# Patient Record
Sex: Male | Born: 1995 | Race: Black or African American | Hispanic: No | Marital: Single | Smoking: Never smoker
Health system: Southern US, Community
[De-identification: ages and names within clinical notes are randomized; demographics above are authoritative.]

---
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
## 2017-04-25 ENCOUNTER — Telehealth: Payer: Self-pay

## 2017-04-25 ENCOUNTER — Telehealth: Payer: Self-pay | Admitting: *Deleted

## 2017-04-25 NOTE — Telephone Encounter (Signed)
I already answered this-- ok to reestablish but probably can not see him 12/24 for cpe --- already have several and that is supposed to be saved for acutes

## 2017-04-25 NOTE — Telephone Encounter (Signed)
Copied from CRM [phone]. Topic: Appointment Scheduling - Prior Auth Required for Appointment >> Apr 25, 2017  8:44 AM Jolayne Hainesaylor, Brittany L wrote: Patient is requesting 12/24 for new pt to re est care, last seen in 2014. appointment. Per scheduling protocol, this appointment requires a prior authorization prior to scheduling.  I called the Office & Clydie BraunKaren advised me to send a message to see if Dr. Laury AxonLowne will re consider him for RE EST CARE OF HIM. He has not been seen since 2014 due to being away in college, he is coming in for christmas and wants to see her for a physical but I advised his grandmother he was no longer a patient & had to re est care. I sch the appt per one note that states she sees 1 new pt per session not paying attention that it has to be approved by the Dr. Laury AxonLowne, please call patient if this needs to be canceled if she will not approve it. I do apologize.

## 2017-04-25 NOTE — Telephone Encounter (Signed)
Pt requesting to re-establish care. Please adivse.

## 2017-04-25 NOTE — Telephone Encounter (Signed)
Copied from CRM #[id number]. Topic: Appointment Scheduling - Prior Auth Required for Appointment >> Apr 25, 2017  8:44 AM Taylor, Brittany L wrote: Patient is requesting 12/24 for new pt to re est care, last seen in 2014. appointment. Per scheduling protocol, this appointment requires a prior authorization prior to scheduling.  I called the Office & Karen advised me to send a message to see if Dr. Lowne will re consider him for RE EST CARE OF HIM. He has not been seen since 2014 due to being away in college, he is coming in for christmas and wants to see her for a physical but I advised his grandmother he was no longer a patient & had to re est care. I sch the appt per one note that states she sees 1 new pt per session not paying attention that it has to be approved by the Dr. Lowne, please call patient if this needs to be canceled if she will not approve it. I do apologize.   

## 2017-04-25 NOTE — Telephone Encounter (Signed)
Pt can have appointment but 12/24 is supposed to be for acutes only

## 2017-05-02 ENCOUNTER — Encounter: Payer: Self-pay | Admitting: Family Medicine

## 2017-05-02 ENCOUNTER — Other Ambulatory Visit (HOSPITAL_COMMUNITY)
Admission: RE | Admit: 2017-05-02 | Discharge: 2017-05-02 | Disposition: A | Discharge status: Home / Self Care | Payer: BLUE CROSS/BLUE SHIELD | Source: Ambulatory Visit | Attending: Family Medicine | Admitting: Family Medicine

## 2017-05-02 ENCOUNTER — Ambulatory Visit: Payer: BLUE CROSS/BLUE SHIELD | Admitting: Family Medicine

## 2017-05-02 VITALS — BP 104/74 | HR 73 | Temp 98.4°F | Ht 75.0 in | Wt 135.0 lb

## 2017-05-02 DIAGNOSIS — Z0000 Encounter for general adult medical examination without abnormal findings: Secondary | ICD-10-CM | POA: Diagnosis not present

## 2017-05-02 DIAGNOSIS — Z7252 High risk homosexual behavior: Secondary | ICD-10-CM | POA: Insufficient documentation

## 2017-05-02 DIAGNOSIS — Z23 Encounter for immunization: Secondary | ICD-10-CM | POA: Diagnosis not present

## 2017-05-02 LAB — COMPREHENSIVE METABOLIC PANEL
ALK PHOS: 44 U/L (ref 39–117)
ALT: 10 U/L (ref 0–53)
AST: 14 U/L (ref 0–37)
Albumin: 4.6 g/dL (ref 3.5–5.2)
BILIRUBIN TOTAL: 0.5 mg/dL (ref 0.2–1.2)
BUN: 12 mg/dL (ref 6–23)
CO2: 30 meq/L (ref 19–32)
Calcium: 9.6 mg/dL (ref 8.4–10.5)
Chloride: 104 mEq/L (ref 96–112)
Creatinine, Ser: 0.9 mg/dL (ref 0.40–1.50)
GFR: 135.53 mL/min (ref >60.00)
GLUCOSE: 86 mg/dL (ref 70–99)
Potassium: 4.6 mEq/L (ref 3.5–5.1)
SODIUM: 141 meq/L (ref 135–145)
TOTAL PROTEIN: 7.1 g/dL (ref 6.0–8.3)

## 2017-05-02 LAB — CBC WITH DIFFERENTIAL/PLATELET
Basophils Absolute: 0 10*3/uL (ref 0.0–0.1)
Basophils Relative: 0.9 % (ref 0.0–3.0)
EOS PCT: 1.8 % (ref 0.0–5.0)
Eosinophils Absolute: 0.1 10*3/uL (ref 0.0–0.7)
HEMATOCRIT: 44.1 % (ref 39.0–52.0)
HEMOGLOBIN: 14.2 g/dL (ref 13.0–17.0)
LYMPHS ABS: 2.5 10*3/uL (ref 0.7–4.0)
LYMPHS PCT: 53.9 % — AB (ref 12.0–46.0)
MCHC: 32.2 g/dL (ref 30.0–36.0)
MCV: 91.2 fl (ref 78.0–100.0)
MONOS PCT: 8.3 % (ref 3.0–12.0)
Monocytes Absolute: 0.4 10*3/uL (ref 0.1–1.0)
Neutro Abs: 1.6 10*3/uL (ref 1.4–7.7)
Neutrophils Relative %: 35.1 % — ABNORMAL LOW (ref 43.0–77.0)
Platelets: 301 10*3/uL (ref 150.0–400.0)
RBC: 4.83 Mil/uL (ref 4.22–5.81)
RDW: 13.5 % (ref 11.5–15.5)
WBC: 4.6 10*3/uL (ref 4.0–10.5)

## 2017-05-02 LAB — LIPID PANEL
Cholesterol: 137 mg/dL (ref 0–200)
HDL: 73.4 mg/dL (ref >39.00)
LDL Cholesterol: 56 mg/dL (ref 0–99)
NONHDL: 63.76
Total CHOL/HDL Ratio: 2
Triglycerides: 40 mg/dL (ref 0.0–149.0)
VLDL: 8 mg/dL (ref 0.0–40.0)

## 2017-05-02 LAB — TSH: TSH: 1.28 u[IU]/mL (ref 0.35–4.50)

## 2017-05-02 MED ORDER — EMTRICITABINE-TENOFOVIR DF 200-300 MG PO TABS: 1.0000 | ORAL_TABLET | Freq: Every day | ORAL | 2 refills | Status: DC

## 2017-05-02 NOTE — Assessment & Plan Note (Signed)
See avs ghm utd rto 1 month for men b #2

## 2017-05-02 NOTE — Patient Instructions (Signed)
Preventive Care 18-39 Years, Male Preventive care refers to lifestyle choices and visits with your health care provider that can promote health and wellness. What does preventive care include?  A yearly physical exam. This is also called an annual well check.  Dental exams once or twice a year.  Routine eye exams. Ask your health care provider how often you should have your eyes checked.  Personal lifestyle choices, including: ? Daily care of your teeth and gums. ? Regular physical activity. ? Eating a healthy diet. ? Avoiding tobacco and drug use. ? Limiting alcohol use. ? Practicing safe sex. What happens during an annual well check? The services and screenings done by your health care provider during your annual well check will depend on your age, overall health, lifestyle risk factors, and family history of disease. Counseling Your health care provider may ask you questions about your:  Alcohol use.  Tobacco use.  Drug use.  Emotional well-being.  Home and relationship well-being.  Sexual activity.  Eating habits.  Work and work Statistician.  Screening You may have the following tests or measurements:  Height, weight, and BMI.  Blood pressure.  Lipid and cholesterol levels. These may be checked every 5 years starting at age 34.  Diabetes screening. This is done by checking your blood sugar (glucose) after you have not eaten for a while (fasting).  Skin check.  Hepatitis C blood test.  Hepatitis B blood test.  Sexually transmitted disease (STD) testing.  Discuss your test results, treatment options, and if necessary, the need for more tests with your health care provider. Vaccines Your health care provider may recommend certain vaccines, such as:  Influenza vaccine. This is recommended every year.  Tetanus, diphtheria, and acellular pertussis (Tdap, Td) vaccine. You may need a Td booster every 10 years.  Varicella vaccine. You may need this if you  have not been vaccinated.  HPV vaccine. If you are 23 or younger, you may need three doses over 6 months.  Measles, mumps, and rubella (MMR) vaccine. You may need at least one dose of MMR.You may also need a second dose.  Pneumococcal 13-valent conjugate (PCV13) vaccine. You may need this if you have certain conditions and have not been vaccinated.  Pneumococcal polysaccharide (PPSV23) vaccine. You may need one or two doses if you smoke cigarettes or if you have certain conditions.  Meningococcal vaccine. One dose is recommended if you are age 65-21 years and a first-year college student living in a residence hall, or if you have one of several medical conditions. You may also need additional booster doses.  Hepatitis A vaccine. You may need this if you have certain conditions or if you travel or work in places where you may be exposed to hepatitis A.  Hepatitis B vaccine. You may need this if you have certain conditions or if you travel or work in places where you may be exposed to hepatitis B.  Haemophilus influenzae type b (Hib) vaccine. You may need this if you have certain risk factors.  Talk to your health care provider about which screenings and vaccines you need and how often you need them. This information is not intended to replace advice given to you by your health care provider. Make sure you discuss any questions you have with your health care provider. Document Released: 06/22/2001 Document Revised: 01/14/2016 Document Reviewed: 02/25/2015 Elsevier Interactive Patient Education  [employer].

## 2017-05-02 NOTE — Assessment & Plan Note (Signed)
rto repeat labs

## 2017-05-02 NOTE — Progress Notes (Signed)
Subjective:  I acted as a Education administrator for [employer], [place]   Patient ID: Johnny Nelson, male    DOB: January 28, 1996, 21 y.o.   MRN: [id number]  Chief Complaint  Patient presents with  . Establish Care    Pt has been out of care for about 4 years and is here today for routine check up and labs.   . Labs Only    Wants HIV testing added to labs today.     HPI  Patient is in today to establish care. No complaints Patient Care Team: Carollee Herter, Alferd Apa, DO as PCP - General (Family Medicine)   History reviewed. No pertinent past medical history.  History reviewed. No pertinent surgical history.  Family History  Problem Relation Age of Onset  . Multiple sclerosis Mother   . Diabetes Maternal Grandmother   . Hyperlipidemia Unknown     Social History   Socioeconomic History  . Marital status: Single    Spouse name: Not on file  . Number of children: Not on file  . Years of education: Not on file  . Highest education level: Not on file  Social Needs  . Financial resource strain: Not on file  . Food insecurity - worry: Never true  . Food insecurity - inability: Never true  . Transportation needs - medical: No  . Transportation needs - non-medical: No  Occupational History  . Not on file  Tobacco Use  . Smoking status: Never Smoker  . Smokeless tobacco: Never Used  Substance and Sexual Activity  . Alcohol use: No  . Drug use: No  . Sexual activity: Yes    Partners: Male    Birth control/protection: None  Other Topics Concern  . Not on file  Social History Narrative   Lives with mom and 2 younger siblings   Pt in college at [employer] Medications Prior to Visit  Medication Sig Dispense Refill  . cyclobenzaprine (FLEXERIL) 5 MG tablet 1-2 qhs prn 14 tablet 0  . traMADol (ULTRAM) 50 MG tablet 1 q 8 hrs prn 30 tablet 0   No facility-administered medications prior to visit.     No Known Allergies  Review of Systems  Constitutional:  Negative for chills, fever and malaise/fatigue.  HENT: Negative for congestion and hearing loss.   Eyes: Negative for blurred vision and discharge.  Respiratory: Negative for cough, sputum production and shortness of breath.   Cardiovascular: Negative for chest pain, palpitations and leg swelling.  Gastrointestinal: Negative for abdominal pain, blood in stool, constipation, diarrhea, heartburn, nausea and vomiting.  Genitourinary: Negative for dysuria, frequency, hematuria and urgency.  Musculoskeletal: Negative for back pain, falls and myalgias.  Skin: Negative for rash.  Neurological: Negative for dizziness, sensory change, loss of consciousness, weakness and headaches.  Endo/Heme/Allergies: Negative for environmental allergies. Does not bruise/bleed easily.  Psychiatric/Behavioral: Negative for depression and suicidal ideas. The patient is not nervous/anxious and does not have insomnia.        Objective:    Physical Exam  Constitutional: He is oriented to person, place, and time. He appears well-developed and well-nourished. No distress.  HENT:  Head: Normocephalic and atraumatic.  Right Ear: External ear normal.  Left Ear: External ear normal.  Nose: Nose normal.  Mouth/Throat: Oropharynx is clear and moist. No oropharyngeal exudate.  Eyes: Conjunctivae and EOM are normal. Pupils are equal, round, and reactive to light. Right eye exhibits no discharge. Left eye exhibits no discharge.  Neck: Normal  range of motion. Neck supple. No JVD present. No thyromegaly present.  Cardiovascular: Normal rate, regular rhythm and intact distal pulses. Exam reveals no gallop and no friction rub.  No murmur heard. Pulmonary/Chest: Effort normal and breath sounds normal. No respiratory distress. He has no wheezes. He has no rales. He exhibits no tenderness.  Abdominal: Soft. Bowel sounds are normal. He exhibits no distension and no mass. There is no tenderness. There is no rebound and no guarding.    Genitourinary: Penis normal.  Musculoskeletal: Normal range of motion. He exhibits no edema or tenderness.  Lymphadenopathy:    He has no cervical adenopathy.  Neurological: He is alert and oriented to person, place, and time. He displays normal reflexes. He exhibits normal muscle tone.  Skin: Skin is warm and dry. No rash noted. He is not diaphoretic. No erythema. No pallor.  Psychiatric: He has a normal mood and affect. His behavior is normal. Judgment and thought content normal.  Nursing note and vitals reviewed.   BP 104/74   Pulse 73   Temp 98.4 F (36.9 C) (Oral)   Ht _0  (1.905 m)   Wt 135 lb (61.2 kg)   SpO2 98%   BMI 16.87 kg/m  Wt Readings from Last 3 Encounters:  05/02/17 135 lb (61.2 kg)  06/07/13 146 lb 9.6 oz (66.5 kg) (46 %, Z= -0.11)*  01/30/13 141 lb 3.2 oz (64 kg) (39 %, Z= -0.28)*   * Growth percentiles are based on CDC (Boys, 2-20 Years) data.   BP Readings from Last 3 Encounters:  05/02/17 104/74  06/07/13 104/67  01/30/13 (!) 90/62 (<1 %, Z < -2.33 /  15 %, Z = -1.04)*   *BP percentiles are based on the August 2017 AAP Clinical Practice Guideline for boys     Immunization History  Administered Date(s) Administered  . DTP 05/20/1995, 07/29/1995, 10/05/1995, 09/14/1996, 04/01/1999  . Hepatitis A 09/07/2006, 11/11/2009  . Hepatitis B 1994/11/12, 04/19/1995, 10/05/1995  . HiB (PRP-OMP) 05/20/1995, 07/29/1995, 10/05/1995, 09/14/1996  . Hpv 11/11/2009, 01/20/2010, 05/28/2010  . Influenza,inj,Quad PF,6+ Mos 01/30/2013  . Influenza-Unspecified 02/06/2017  . MMR 03/27/1996, 04/01/1999  . Meningococcal B, OMV 05/02/2017  . Meningococcal Mcv4o 05/02/2017  . Meningococcal Polysaccharide 10/30/2007  . OPV 05/20/1995, 07/29/1995, 10/05/1995, 04/01/1999  . PPD Test 10/18/2011  . Td 09/07/2006  . Tdap 05/02/2017  . Varicella 10/30/2007, 11/11/2009    Health Maintenance  Topic Date Due  . Samul Dada  09/06/2016  . INFLUENZA VACCINE  12/08/2016  .  HIV Screening  Completed    Lab Results  Component Value Date   WBC 5.0 12/14/2011   HGB 13.9 12/14/2011   HCT 42.4 12/14/2011   PLT 290.0 12/14/2011   GLUCOSE 98 12/14/2011   CHOL 166 12/14/2011   TRIG 48.0 12/14/2011   HDL 83.30 12/14/2011   LDLCALC 73 12/14/2011   ALT 15 12/14/2011   AST 18 12/14/2011   NA 142 12/14/2011   K 4.5 12/14/2011   CL 103 12/14/2011   CREATININE 0.9 12/14/2011   BUN 15 12/14/2011   CO2 29 12/14/2011   TSH 0.87 12/14/2011    Lab Results  Component Value Date   TSH 0.87 12/14/2011   Lab Results  Component Value Date   WBC 5.0 12/14/2011   HGB 13.9 12/14/2011   HCT 42.4 12/14/2011   MCV 90.1 12/14/2011   PLT 290.0 12/14/2011   Lab Results  Component Value Date   NA 142 12/14/2011   K 4.5 12/14/2011  CO2 29 12/14/2011   GLUCOSE 98 12/14/2011   BUN 15 12/14/2011   CREATININE 0.9 12/14/2011   BILITOT 0.8 12/14/2011   ALKPHOS 87 12/14/2011   AST 18 12/14/2011   ALT 15 12/14/2011   PROT 7.2 12/14/2011   ALBUMIN 4.7 12/14/2011   CALCIUM 9.8 12/14/2011   GFR 149.14 12/14/2011   Lab Results  Component Value Date   CHOL 166 12/14/2011   Lab Results  Component Value Date   HDL 83.30 12/14/2011   Lab Results  Component Value Date   LDLCALC 73 12/14/2011   Lab Results  Component Value Date   TRIG 48.0 12/14/2011   Lab Results  Component Value Date   CHOLHDL 2 12/14/2011   No results found for: HGBA1C       Assessment & Plan:   Problem List Items Addressed This Visit      Unprioritized   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    rto repeat labs      Relevant Medications   emtricitabine-tenofovir (TRUVADA) 200-300 MG tablet   Other Relevant Orders   HIV antibody   RPR   Urine cytology ancillary only   Hepatitis, Acute   Preventative health care    See avs ghm utd rto 1 month for men b #2         Relevant Orders   HIV antibody   TSH   Lipid panel   CBC with Differential/Platelet   RPR   Urine  cytology ancillary only   Comprehensive metabolic panel   Hepatitis, Acute    Other Visit Diagnoses    Need for meningitis vaccination       Relevant Orders   MENINGOCOCCAL MCV4O(MENVEO) (Completed)   Meningococcal B, OMV (Completed)   Need for Tdap vaccination       Relevant Orders   Tdap vaccine greater than or equal to 7yo IM (Completed)      I have discontinued Shaiden Andreas's traMADol and cyclobenzaprine. I am also having him start on emtricitabine-tenofovir.  Meds ordered this encounter  Medications  . emtricitabine-tenofovir (TRUVADA) 200-300 MG tablet    Sig: Take 1 tablet by mouth daily.    Dispense:  30 tablet    Refill:  2    CMA served as scribe during this visit. History, Physical and Plan performed by medical provider. Documentation and orders reviewed and attested to.  Ann Held, DO

## 2017-05-03 LAB — RPR: RPR Ser Ql: NONREACTIVE

## 2017-05-03 LAB — HEPATITIS PANEL, ACUTE
Hep A IgM: NONREACTIVE
Hep B C IgM: NONREACTIVE
Hepatitis B Surface Ag: NONREACTIVE
Hepatitis C Ab: NONREACTIVE
SIGNAL TO CUT-OFF: 0.04 (ref <1.00)

## 2017-05-03 LAB — HIV ANTIBODY (ROUTINE TESTING W REFLEX): HIV: NONREACTIVE

## 2017-05-04 LAB — URINE CYTOLOGY ANCILLARY ONLY
Chlamydia: NEGATIVE
Neisseria Gonorrhea: NEGATIVE
TRICH (WINDOWPATH): NEGATIVE

## 2017-05-05 LAB — URINE CYTOLOGY ANCILLARY ONLY
BACTERIAL VAGINITIS: NEGATIVE
CANDIDA VAGINITIS: NEGATIVE

## 2019-02-03 DIAGNOSIS — Z20828 Contact with and (suspected) exposure to other viral communicable diseases: Secondary | ICD-10-CM | POA: Diagnosis not present

## 2019-05-11 DIAGNOSIS — Z8711 Personal history of peptic ulcer disease: Secondary | ICD-10-CM

## 2019-05-11 HISTORY — DX: Personal history of peptic ulcer disease: Z87.11

## 2019-08-06 DIAGNOSIS — Z20828 Contact with and (suspected) exposure to other viral communicable diseases: Secondary | ICD-10-CM | POA: Diagnosis not present

## 2019-12-04 DIAGNOSIS — K219 Gastro-esophageal reflux disease without esophagitis: Secondary | ICD-10-CM | POA: Diagnosis not present

## 2020-05-10 DIAGNOSIS — K219 Gastro-esophageal reflux disease without esophagitis: Secondary | ICD-10-CM

## 2020-05-10 HISTORY — DX: Gastro-esophageal reflux disease without esophagitis: K21.9

## 2020-12-05 DIAGNOSIS — R059 Cough, unspecified: Secondary | ICD-10-CM | POA: Diagnosis not present

## 2020-12-05 DIAGNOSIS — U071 COVID-19: Secondary | ICD-10-CM | POA: Diagnosis not present

## 2021-06-17 ENCOUNTER — Ambulatory Visit: Payer: BLUE CROSS/BLUE SHIELD | Admitting: Family Medicine

## 2021-06-30 ENCOUNTER — Ambulatory Visit (INDEPENDENT_AMBULATORY_CARE_PROVIDER_SITE_OTHER): Payer: 59 | Admitting: Family Medicine

## 2021-06-30 ENCOUNTER — Encounter: Payer: Self-pay | Admitting: Family Medicine

## 2021-06-30 ENCOUNTER — Other Ambulatory Visit (HOSPITAL_COMMUNITY): Payer: Self-pay

## 2021-06-30 ENCOUNTER — Telehealth: Payer: Self-pay

## 2021-06-30 VITALS — BP 125/71 | HR 95 | Ht 75.0 in | Wt 141.8 lb

## 2021-06-30 DIAGNOSIS — J392 Other diseases of pharynx: Secondary | ICD-10-CM

## 2021-06-30 DIAGNOSIS — Z7252 High risk homosexual behavior: Secondary | ICD-10-CM | POA: Diagnosis not present

## 2021-06-30 DIAGNOSIS — Z23 Encounter for immunization: Secondary | ICD-10-CM | POA: Diagnosis not present

## 2021-06-30 NOTE — Telephone Encounter (Signed)
[facility] Patient Product/process development scientist completed.    The patient is insured through [employer].  Truvada $0.00 [place] $211.11 will need a copay card. Apretude is covered under medical benefits but will need a PA.  We will continue to follow to see if copay assistance is needed.  Clearance Coots, CPhT [facility] Phone: [phone] Fax:  [phone]

## 2021-06-30 NOTE — Patient Instructions (Signed)
Thank you for choosing [facility] at [facility] for your Primary Care needs. I am excited for the opportunity to partner with you to meet your health care goals. It was a pleasure meeting you today!  Recommendations from today's visit: Let me know if your throat symptoms do not clear up in the next few weeks and we can get you to ENT  Information on diet, exercise, and health maintenance recommendations are listed below. This is information to help you be sure you are on track for optimal health and monitoring.   Please look over this and let us know if you have any questions or if you have completed any of the health maintenance outside of [place] so that we can be sure your records are up to date.  ___________________________________________________________  MyChart:  For all urgent or time sensitive needs we ask that you please call the office to avoid delays. Our number is [phone]. MyChart is not constantly monitored and due to the large volume of messages a day, replies may take up to 72 business hours.  MyChart Policy: MyChart allows for you to see your visit notes, after visit summary, provider recommendations, lab and tests results, make an appointment, request refills, and contact your provider or the office for non-urgent questions or concerns. Providers are seeing patients during normal business hours and do not have built in time to review MyChart messages.  We ask that you allow a minimum of 3 business days for responses to [employer]. For this reason, please do not send urgent requests through [place]. Please call the office at [phone]. New and ongoing conditions may require a visit. We have virtual and in-person visits available for your convenience.  Complex MyChart concerns may require a visit. Your provider may request you schedule a virtual or in-person visit to ensure we are providing the best care possible. MyChart messages sent after  11:00 AM on Friday will not be received by the provider until Monday morning.    Lab and Test Results: You will receive your lab and test results on MyChart as soon as they are completed and results have been sent by the lab or testing facility. Due to this service, you will receive your results BEFORE your provider.  I review lab and test results each morning prior to seeing patients. Some results require collaboration with other providers to ensure you are receiving the most appropriate care. For this reason, we ask that you please allow a minimum of 3-5 business days from the time that ALL results have been received for your provider to receive and review lab and test results and contact you about these.  Most lab and test result comments from the provider will be sent through [place]. Your provider may recommend changes to the plan of care, follow-up visits, repeat testing, ask questions, or request an office visit to discuss these results. You may reply directly to this message or call the office to provide information for the provider or set up an appointment. In some instances, you will be called with test results and recommendations. Please let us know if this is preferred and we will make note of this in your chart to provide this for you.    If you have not heard a response to your lab or test results in 5 business days from all results returning to [place], please call the office to let us know. We ask that you please avoid calling prior to this  time unless there is an emergent concern. Due to high call volumes, this can delay the resulting process.  After Hours: For all non-emergency after hours needs, please call the office at [phone] and select the option to reach the on-call  service. On-call services are shared between multiple [facility] offices and therefore it will not be possible to speak directly with your provider. On-call providers may provide medical advice and  recommendations, but are unable to provide refills for maintenance medications.  For all emergency or urgent medical needs after normal business hours, we recommend that you seek care at the closest Urgent Care or Emergency Department to ensure appropriate treatment in a timely manner.  [facility] has a 24 hour emergency room located on the ground floor for your convenience.   Urgent Concerns During the Business Day Providers are seeing patients from 8AM to [place] with a busy schedule and are most often not able to respond to non-urgent calls until the end of the day or the next business day. If you should have URGENT concerns during the day, please call and speak to the nurse or schedule a same day appointment so that we can address your concern without delay.   Thank you, again, for choosing me as your health care partner. I appreciate your trust and look forward to learning more about you.   [employer] [employer] Olevia Bowens, DNP, FNP-C  ___________________________________________________________  Health Maintenance Recommendations Screening Testing Mammogram Every 1-2 years based on history and risk factors Starting at age 27 Pap Smear Ages 21-39 every 3 years Ages 50-65 every 5 years with HPV testing More frequent testing may be required based on results and history Colon Cancer Screening Every 1-10 years based on test performed, risk factors, and history Starting at age 27 Bone Density Screening Every 2-10 years based on history Starting at age 27 for women Recommendations for men differ based on medication usage, history, and risk factors AAA Screening One time ultrasound Men 27-27 years old who have ever smoked Lung Cancer Screening Low Dose Lung CT every 12 months Age 27-27 years with a 20 pack-year smoking history who still smoke or who have quit within the last 15 years  Screening Labs Routine  Labs: Complete Blood Count (CBC), Complete Metabolic Panel (CMP),  Cholesterol (Lipid Panel) Every 6-12 months based on history and medications May be recommended more frequently based on current conditions or previous results Hemoglobin A1c Lab Every 3-12 months based on history and previous results Starting at age 17 or earlier with diagnosis of diabetes, high cholesterol, BMI >26, and/or risk factors Frequent monitoring for patients with diabetes to ensure blood sugar control Thyroid Panel (TSH w/ T3 & T4) Every 6 months based on history, symptoms, and risk factors May be repeated more often if on medication HIV One time testing for all patients 52 and older May be repeated more frequently for patients with increased risk factors or exposure Hepatitis C One time testing for all patients 1 and older May be repeated more frequently for patients with increased risk factors or exposure Gonorrhea, Chlamydia Every 12 months for all sexually active persons 13-24 years Additional monitoring may be recommended for those who are considered high risk or who have symptoms PSA Men 19-53 years old with risk factors Additional screening may be recommended from age 9-69 based on risk factors, symptoms, and history  Vaccine Recommendations Tetanus Booster All adults every 10 years Flu Vaccine All patients 6 months and older every year COVID  Vaccine All patients 12 years and older Initial dosing with booster May recommend additional booster based on age and health history HPV Vaccine 2 doses all patients age 41-26 Dosing may be considered for patients over 26 Shingles Vaccine (Shingrix) 2 doses all adults 21 years and older Pneumonia (Pneumovax 23) All adults 69 years and older May recommend earlier dosing based on health history Pneumonia (Prevnar 51) All adults 63 years and older Dosed 1 year after Pneumovax 23 Pneumonia (Prevnar 53) All adults 80 years and older (adults [id number] with certain conditions or risk factors) 1 dose  For those who have no  received Prevnar 13 vaccine previously   Additional Screening, Testing, and Vaccinations may be recommended on an individualized basis based on family history, health history, risk factors, and/or exposure.  __________________________________________________________  Diet Recommendations for All Patients  I recommend that all patients maintain a diet low in saturated fats, carbohydrates, and cholesterol. While this can be challenging at first, it is not impossible and small changes can make big differences.  Things to try: Decreasing the amount of soda, sweet tea, and/or juice to one or less per day and replace with water While water is always the first choice, if you do not like water you may consider adding a water additive without sugar to improve the taste other sugar free drinks Replace potatoes with a brightly colored vegetable at dinner Use healthy oils, such as canola oil or olive oil, instead of butter or hard margarine Limit your bread intake to two pieces or less a day Replace regular pasta with low carb pasta options Bake, broil, or grill foods instead of frying Monitor portion sizes  Eat smaller, more frequent meals throughout the day instead of large meals  An important thing to remember is, if you love foods that are not great for your health, you don't have to give them up completely. Instead, allow these foods to be a reward when you have done well. Allowing yourself to still have special treats every once in a while is a nice way to tell yourself thank you for working hard to keep yourself healthy.   Also remember that every day is a new day. If you have a bad day and "fall off the wagon", you can still climb right back up and keep moving along on your journey!  We have resources available to help you!  Some websites that may be helpful include: www.http://carter.biz/  Www.VeryWellFit.com _____________________________________________________________  Activity  Recommendations for All Patients  I recommend that all adults get at least 20 minutes of moderate physical activity that elevates your heart rate at least 5 days out of the week.  Some examples include: Walking or jogging at a pace that allows you to carry on a conversation Cycling (stationary bike or outdoors) Water aerobics Yoga Weight lifting Dancing If physical limitations prevent you from putting stress on your joints, exercise in a pool or seated in a chair are excellent options.  Do determine your MAXIMUM heart rate for activity: YOUR AGE - 220 = MAX HeartRate   Remember! Do not push yourself too hard.  Start slowly and build up your pace, speed, weight, time in exercise, etc.  Allow your body to rest between exercise and get good sleep. You will need more water than normal when you are exerting yourself. Do not wait until you are thirsty to drink. Drink with a purpose of getting in at least 8, 8 ounce glasses of water a day plus more depending  on how much you exercise and sweat.    If you begin to develop dizziness, chest pain, abdominal pain, jaw pain, shortness of breath, headache, vision changes, lightheadedness, or other concerning symptoms, stop the activity and allow your body to rest. If your symptoms are severe, seek emergency evaluation immediately. If your symptoms are concerning, but not severe, please let us know so that we can recommend further evaluation.

## 2021-06-30 NOTE — Progress Notes (Signed)
______________________________________________________________________  HPI Johnny Nelson is a 26 y.o. male presenting to [facility] at [facility] today to establish care.    Patient Care Team: Terrilyn Saver, NP as PCP - General (Family Medicine)   Health Maintenance  Topic Date Due   COVID-19 Vaccine (1) Never done   Tetanus Vaccine  05/03/2027   Flu Shot  Completed   HPV Vaccine  Completed   Hepatitis C Screening: USPSTF Recommendation to screen - Ages 18-79 yo.  Completed   HIV Screening  Completed     Concerns today: - feels like he has something stuck in his throat for the past 3-4 days maybe like a popcorn kernel (has tried drinking hot tea); no pain, fevers, sore throat, rhinorrhea, nasal congestion; not sure of anything he could have eaten that may have gotten stuck; he is a smoker - no voice changes, visible lesions or tongue/mouth, weight loss, cough - PrEP - previously on Descovy, but is interested in checking out other options, insurance will be switching next month due to new job; negative STD screening last month per patient at health department      Patient Active Problem List   Diagnosis Date Noted   Preventative health care 05/02/2017   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 ______________________________________________________________________ PMH No past medical history on file.  ROS All review of systems negative except what is listed in the HPI  PHYSICAL EXAM Physical Exam Vitals reviewed.  Constitutional:      Appearance: Normal appearance. He is normal weight.  HENT:     Head: Normocephalic and atraumatic.     Nose: Nose normal.     Mouth/Throat:     Mouth: Mucous membranes are moist.      Pharynx: Oropharynx is clear. No oropharyngeal exudate or posterior oropharyngeal erythema.  Cardiovascular:     Rate and Rhythm: Normal rate and regular rhythm.     Pulses: Normal pulses.     Heart sounds: Normal heart sounds.  Pulmonary:     Effort: Pulmonary effort is normal.     Breath sounds: Normal breath sounds.  Musculoskeletal:     Cervical back: Normal range of motion and neck supple. No tenderness.  Lymphadenopathy:     Cervical: No cervical adenopathy.  Skin:    General: Skin is warm and dry.  Neurological:     General: No focal deficit present.     Mental Status: He is alert and oriented to person, place, and time. Mental status is at baseline.  Psychiatric:        Mood and Affect: Mood normal.        Behavior: Behavior normal.        Thought Content: Thought content normal.        Judgment: Judgment normal.   ______________________________________________________________________ ASSESSMENT AND PLAN  1. Influenza vaccine needed - Flu Vaccine QUAD 6+ mos PF IM (Fluarix Quad PF)  2. High risk homosexual behavior Patient would like ID referral for PrEP options and future STD screening - declines needing any today.  - Ambulatory referral to Infectious Disease  3. Throat irritation Continue supportive measures for another week or two - warm liquids, soft foods, salt water gargles, etc. Patient aware of signs/symptoms  requiring further/urgent evaluation. If not improving, we can refer to ENT for further evaluation to be cautious since he is a smoker.    Establish care: Education provided today during visit and on AVS for patient to review at home.  Diet and Exercise recommendations provided.  Current diagnoses and recommendations discussed. HM recommendations reviewed with recommendations.    Outpatient Encounter Medications as of 06/30/2021  Medication Sig   [DISCONTINUED] emtricitabine-tenofovir (TRUVADA) 200-300 MG tablet Take 1 tablet by mouth daily.    No facility-administered encounter medications on file as of 06/30/2021.    Return in about 4 months (around 10/28/2021) for physical .    Lovena Le B. Olevia Bowens, DNP, FNP-C

## 2021-07-01 ENCOUNTER — Ambulatory Visit: Payer: 59 | Admitting: Pharmacist

## 2021-07-06 ENCOUNTER — Other Ambulatory Visit: Payer: Self-pay

## 2021-07-06 ENCOUNTER — Encounter (INDEPENDENT_AMBULATORY_CARE_PROVIDER_SITE_OTHER): Payer: Self-pay | Admitting: *Deleted

## 2021-07-06 VITALS — BP 118/75 | HR 66 | Temp 97.6°F | Ht 74.5 in | Wt 138.1 lb

## 2021-07-06 DIAGNOSIS — Z006 Encounter for examination for normal comparison and control in clinical research program: Secondary | ICD-10-CM

## 2021-07-06 NOTE — Research (Signed)
Johnny Nelson was here today to screen for the Purpose 2 prevention study.  He denies any current problems or medications. He had reviewed both the incidence and randomized screening consents at home. We went over the incidence screening consent and he verified that he had had condomless receptive anal sex with 2 partners in the past 12 weeks and had not been tested for HIV. He had been on oral prep over 1 year ago but recently got gonorrhea, so thought he probably needed to get back on Prep.  He was instructed on acute HIV symptoms and HIV prevention.  He tested negative with the rapid HIV test today so we proceeded to the randomized screening visit. Unfortunately we were unable to get his labs drawn today for that part of the study and will have him return in 2 days to complete those.

## 2021-07-07 LAB — RPR: RPR Ser Ql: NONREACTIVE

## 2021-07-08 ENCOUNTER — Other Ambulatory Visit: Payer: Self-pay

## 2021-07-08 ENCOUNTER — Encounter (INDEPENDENT_AMBULATORY_CARE_PROVIDER_SITE_OTHER): Payer: Self-pay | Admitting: *Deleted

## 2021-07-08 DIAGNOSIS — Z006 Encounter for examination for normal comparison and control in clinical research program: Secondary | ICD-10-CM

## 2021-07-08 NOTE — Research (Signed)
Johnny Nelson was here today to obtain lab samples for the Purpose 2 randomized screening. Rapid test was done which was negative. Blood samples drawn at 0900. Urine and swabs obtained. He denies any new problems or medications and will return on 3/13 for entry as long as he is deemed eligible by that time. ?

## 2021-07-20 ENCOUNTER — Ambulatory Visit: Payer: 59 | Admitting: Pharmacist

## 2021-07-20 ENCOUNTER — Encounter: Payer: 59 | Admitting: *Deleted

## 2021-07-21 ENCOUNTER — Ambulatory Visit: Payer: 59 | Admitting: Pharmacist

## 2021-07-21 ENCOUNTER — Encounter: Payer: 59 | Admitting: *Deleted

## 2021-07-23 ENCOUNTER — Ambulatory Visit: Payer: 59 | Admitting: Pharmacist

## 2021-07-23 ENCOUNTER — Other Ambulatory Visit: Payer: Self-pay

## 2021-07-23 ENCOUNTER — Encounter (INDEPENDENT_AMBULATORY_CARE_PROVIDER_SITE_OTHER): Payer: 59 | Admitting: *Deleted

## 2021-07-23 ENCOUNTER — Encounter: Payer: 59 | Admitting: *Deleted

## 2021-07-23 VITALS — BP 117/75 | HR 73 | Temp 97.7°F | Wt 145.4 lb

## 2021-07-23 DIAGNOSIS — Z006 Encounter for examination for normal comparison and control in clinical research program: Secondary | ICD-10-CM

## 2021-07-23 NOTE — Research (Signed)
Johnny Nelson was here for entry into the Purpose 2 study. Once rapid HIV test was confirmed negative and he denied any symptoms of acute HIV ,he was randomized in to the study to receive Truvada/placebo and lenacapavir/placebo. Unfortunately, the pharmacy was unable to dispense the meds today because the application for pharmacy access was not set up properly. We will have him come back tomorrow to receive the medications. All other procedures prior to dispensing meds were done. He denies any current problems. He did have about 1 day of diarrhea on Monday which he thinks was from some food he ate at a restaurant that day.  ?

## 2021-07-23 NOTE — Addendum Note (Signed)
Addended by: Phill Myron on: 07/23/2021 03:28 PM ? ? Modules accepted: Orders ? ?

## 2021-07-24 ENCOUNTER — Encounter (INDEPENDENT_AMBULATORY_CARE_PROVIDER_SITE_OTHER): Payer: 59 | Admitting: *Deleted

## 2021-07-24 ENCOUNTER — Other Ambulatory Visit: Payer: Self-pay

## 2021-07-24 ENCOUNTER — Ambulatory Visit (INDEPENDENT_AMBULATORY_CARE_PROVIDER_SITE_OTHER): Payer: 59 | Admitting: Pharmacist

## 2021-07-24 DIAGNOSIS — Z006 Encounter for examination for normal comparison and control in clinical research program: Secondary | ICD-10-CM

## 2021-07-24 DIAGNOSIS — Z79899 Other long term (current) drug therapy: Secondary | ICD-10-CM

## 2021-07-24 LAB — RPR: RPR Ser Ql: NONREACTIVE

## 2021-07-24 MED ORDER — STUDY - PURPOSE 2 - EMTRICITABINE/TENOFOVIR DISOPROXIL FUMARATE 200-300 MG (TRUVADA) OR PLACEBO TABLET (PI-VAN DAM): 1.0000 | ORAL_TABLET | Freq: Every day | ORAL | 0 refills | Status: DC

## 2021-07-24 MED ORDER — STUDY - PURPOSE 2 - LENACAPAVIR 300 MG OR PLACEBO TABLET (PI-VAN DAM): 600.0000 mg | ORAL_TABLET | Freq: Every day | ORAL | 0 refills | Status: DC

## 2021-07-24 MED ORDER — STUDY - PURPOSE 2 - LENACAPAVIR 309 MG/ML OR PLACEBO INJECTION (PI-VAN DAM): 927.0000 mg | INJECTION | Freq: Once | SUBCUTANEOUS | Status: DC

## 2021-07-24 MED ORDER — STUDY - PURPOSE 2 - LENACAPAVIR 309 MG/ML OR PLACEBO INJECTION (PI-VAN DAM)
927.0000 mg | INJECTION | Freq: Once | SUBCUTANEOUS | Status: DC
  Filled 2021-07-24: qty 3

## 2021-07-24 NOTE — Research (Signed)
Johnny Nelson returned today to receive his medications for the Purpose 2 study. He denied any new problems. Oral lenacapavir/p and truvada/p were given with 240 oz. H2O at 10:43. The 2 injections of lenacapavir/placebo were given by Margarite Gouge at 10:53 and 10:54am without problem. He was instructed to call us for concerns and let us know about side effects. Dosing and adherence instructions were given he is to take the Truvada/p every day and the other 2 lenacapavir /p tablets tomorrow. We will call him next Friday and he will be coming for the next study visit in 4 weeks.  ?

## 2021-07-24 NOTE — Progress Notes (Signed)
? ?  07/24/2021 ? ?HPI: Johnny Nelson is a 26 y.o. male who presents for PURPOSE 2 injection administration ? ?Lenacapavir/placebo injection #1 given in lower right quadrant of the abdomen. Lenacapavir/placebo injection #2 given at least 4 inches away in a counterclockwise manner. A sterile gauze was applied immediately to injection sites once injection was administered to block any potential unblinding.  Patient tolerated well. Documented injection timing and other pertinent information per research requirements.  ? ? ?Margarite Gouge, PharmD, CPP ?Clinical Pharmacist Practitioner ?Infectious Diseases Clinical Pharmacist ?[facility] ? ?07/24/2021, 11:18 AM ? ? ?

## 2021-08-20 ENCOUNTER — Other Ambulatory Visit: Payer: Self-pay

## 2021-08-21 ENCOUNTER — Encounter: Payer: 59 | Admitting: *Deleted

## 2021-08-24 ENCOUNTER — Encounter (INDEPENDENT_AMBULATORY_CARE_PROVIDER_SITE_OTHER): Payer: Self-pay | Admitting: *Deleted

## 2021-08-24 ENCOUNTER — Other Ambulatory Visit: Payer: Self-pay

## 2021-08-24 VITALS — BP 125/63 | HR 72 | Temp 97.5°F | Resp 16 | Wt 142.8 lb

## 2021-08-24 DIAGNOSIS — Z006 Encounter for examination for normal comparison and control in clinical research program: Secondary | ICD-10-CM

## 2021-08-24 MED ORDER — STUDY - PURPOSE 2 - EMTRICITABINE/TENOFOVIR DISOPROXIL FUMARATE 200-300 MG (TRUVADA) OR PLACEBO TABLET (PI-VAN DAM): 1.0000 | ORAL_TABLET | Freq: Every day | ORAL | 0 refills | Status: DC

## 2021-08-24 NOTE — Research (Signed)
Willey seen by Research for week 4 visit on Purpose 2 Study.  Injection sites assessed. Rufus is adherent with study medications. Will be seen again in May for week 8 visit.  ?

## 2021-10-09 ENCOUNTER — Encounter: Payer: 59 | Admitting: *Deleted

## 2021-10-12 ENCOUNTER — Encounter (INDEPENDENT_AMBULATORY_CARE_PROVIDER_SITE_OTHER): Payer: 59 | Admitting: *Deleted

## 2021-10-12 ENCOUNTER — Other Ambulatory Visit: Payer: Self-pay

## 2021-10-12 VITALS — BP 115/77 | HR 76 | Temp 97.6°F | Wt 148.2 lb

## 2021-10-12 DIAGNOSIS — Z006 Encounter for examination for normal comparison and control in clinical research program: Secondary | ICD-10-CM

## 2021-10-12 NOTE — Research (Signed)
Johnny Nelson was here today for the Purpose 2 study. He wanted to come off study due to the size of the truvada tablets (he has trouble swallowing them) and the nodules from the lenacapavir injections. We are able to move him to the PK tail phase of the study on oral Descovy. He had stopped the truvada/placebo around May 1st. He said he had noticed some tenderness and swelling of his left nipple about 2 weeks ago that last 4 days. He is not on any other meds at this time. His RUQ abdomen injection site is nodular 3.5 x 2.5cm and the RLQ abdomen injection site is indurated at 3X 1cm. He says they are not tender but does wonder when they will go away. Next visit will be in 13 weeks.

## 2021-10-13 LAB — RPR: RPR Ser Ql: NONREACTIVE

## 2021-10-30 ENCOUNTER — Encounter: Payer: Self-pay | Admitting: Family Medicine

## 2021-10-30 ENCOUNTER — Ambulatory Visit (INDEPENDENT_AMBULATORY_CARE_PROVIDER_SITE_OTHER): Payer: BC Managed Care – PPO | Admitting: Family Medicine

## 2021-10-30 VITALS — BP 114/59 | HR 75 | Ht 74.5 in | Wt 143.8 lb

## 2021-10-30 DIAGNOSIS — Z0000 Encounter for general adult medical examination without abnormal findings: Secondary | ICD-10-CM | POA: Diagnosis not present

## 2021-10-30 LAB — COMPREHENSIVE METABOLIC PANEL
ALT: 8 U/L (ref 0–53)
AST: 12 U/L (ref 0–37)
Albumin: 4.8 g/dL (ref 3.5–5.2)
Alkaline Phosphatase: 43 U/L (ref 39–117)
BUN: 12 mg/dL (ref 6–23)
CO2: 30 mEq/L (ref 19–32)
Calcium: 9.9 mg/dL (ref 8.4–10.5)
Chloride: 104 mEq/L (ref 96–112)
Creatinine, Ser: 1.01 mg/dL (ref 0.40–1.50)
GFR: 102.55 mL/min (ref >60.00)
Glucose, Bld: 91 mg/dL (ref 70–99)
Potassium: 4.5 mEq/L (ref 3.5–5.1)
Sodium: 142 mEq/L (ref 135–145)
Total Bilirubin: 1 mg/dL (ref 0.2–1.2)
Total Protein: 7 g/dL (ref 6.0–8.3)

## 2021-10-30 LAB — CBC
HCT: 41.5 % (ref 39.0–52.0)
Hemoglobin: 13.8 g/dL (ref 13.0–17.0)
MCHC: 33.2 g/dL (ref 30.0–36.0)
MCV: 89.9 fl (ref 78.0–100.0)
Platelets: 305 10*3/uL (ref 150.0–400.0)
RBC: 4.62 Mil/uL (ref 4.22–5.81)
RDW: 13.2 % (ref 11.5–15.5)
WBC: 4.4 10*3/uL (ref 4.0–10.5)

## 2021-10-30 LAB — LIPID PANEL
Cholesterol: 171 mg/dL (ref 0–200)
HDL: 78.2 mg/dL (ref >39.00)
LDL Cholesterol: 81 mg/dL (ref 0–99)
NonHDL: 92.83
Total CHOL/HDL Ratio: 2
Triglycerides: 61 mg/dL (ref 0.0–149.0)
VLDL: 12.2 mg/dL (ref 0.0–40.0)

## 2021-10-30 LAB — TSH: TSH: 0.53 u[IU]/mL (ref 0.35–5.50)

## 2021-11-03 ENCOUNTER — Encounter: Payer: Self-pay | Admitting: *Deleted

## 2021-11-15 DIAGNOSIS — I889 Nonspecific lymphadenitis, unspecified: Secondary | ICD-10-CM | POA: Diagnosis not present

## 2021-12-12 ENCOUNTER — Encounter: Payer: Self-pay | Admitting: Emergency Medicine

## 2021-12-12 ENCOUNTER — Ambulatory Visit
Admission: EM | Admit: 2021-12-12 | Discharge: 2021-12-12 | Disposition: A | Discharge status: Home / Self Care | Payer: BC Managed Care – PPO | Attending: Urgent Care | Admitting: Urgent Care

## 2021-12-12 ENCOUNTER — Ambulatory Visit (INDEPENDENT_AMBULATORY_CARE_PROVIDER_SITE_OTHER): Payer: BC Managed Care – PPO

## 2021-12-12 DIAGNOSIS — F1290 Cannabis use, unspecified, uncomplicated: Secondary | ICD-10-CM | POA: Diagnosis not present

## 2021-12-12 DIAGNOSIS — R062 Wheezing: Secondary | ICD-10-CM | POA: Diagnosis not present

## 2021-12-12 DIAGNOSIS — R052 Subacute cough: Secondary | ICD-10-CM | POA: Diagnosis not present

## 2021-12-12 DIAGNOSIS — S62306A Unspecified fracture of fifth metacarpal bone, right hand, initial encounter for closed fracture: Secondary | ICD-10-CM

## 2021-12-12 DIAGNOSIS — J988 Other specified respiratory disorders: Secondary | ICD-10-CM

## 2021-12-12 DIAGNOSIS — B9789 Other viral agents as the cause of diseases classified elsewhere: Secondary | ICD-10-CM

## 2021-12-12 DIAGNOSIS — M79641 Pain in right hand: Secondary | ICD-10-CM | POA: Diagnosis not present

## 2021-12-12 MED ORDER — ALBUTEROL SULFATE HFA 108 (90 BASE) MCG/ACT IN AERS: 1.0000 | INHALATION_SPRAY | Freq: Four times a day (QID) | RESPIRATORY_TRACT | 0 refills | Status: DC | PRN

## 2021-12-12 MED ORDER — ACETAMINOPHEN 325 MG PO TABS: 650.0000 mg | ORAL_TABLET | Freq: Four times a day (QID) | ORAL | 0 refills | Status: DC | PRN

## 2021-12-12 MED ORDER — CETIRIZINE HCL 10 MG PO TABS: 10.0000 mg | ORAL_TABLET | Freq: Every day | ORAL | 0 refills | Status: AC

## 2021-12-12 MED ORDER — HYDROCODONE-ACETAMINOPHEN 5-325 MG PO TABS: 1.0000 | ORAL_TABLET | Freq: Four times a day (QID) | ORAL | 0 refills | Status: DC | PRN

## 2021-12-12 MED ORDER — PSEUDOEPHEDRINE HCL 60 MG PO TABS: 60.0000 mg | ORAL_TABLET | Freq: Three times a day (TID) | ORAL | 0 refills | Status: DC | PRN

## 2021-12-12 MED ORDER — PROMETHAZINE-DM 6.25-15 MG/5ML PO SYRP: 5.0000 mL | ORAL_SOLUTION | Freq: Three times a day (TID) | ORAL | 0 refills | Status: DC | PRN

## 2021-12-12 NOTE — Discharge Instructions (Addendum)
We will manage this as a viral syndrome. For sore throat or cough try using a honey-based tea. Use 3 teaspoons of honey with juice squeezed from half lemon. Place shaved pieces of ginger into 1/2-1 cup of water and warm over stove top. Then mix the ingredients and repeat every 4 hours as needed. Please take Tylenol 500mg -650mg  once every 6 hours for fevers, aches and pains. Hydrate very well with at least 2 liters (64 ounces) of water. Eat light meals such as soups (chicken and noodles, chicken wild rice, vegetable).  Do not eat any foods that you are allergic to.  Start an antihistamine like Zyrtec for postnasal drainage, sinus congestion.  You can take this together with pseudoephedrine (Sudafed) at a dose of 60 mg 3 times a day or twice daily as needed for the same kind of congestion.  Use the albuterol inhaler for the wheezing, cough medication as needed.    Make sure you wear the splint at all times.  Follow-up with Dr. as soon as possible for your hand fracture. Please schedule Tylenol for your hand pain.  If you still have pain despite taking Tylenol regularly, this is breakthrough pain.  You can use hydrocodone, a narcotic pain medicine, once every 4-6 hours for this.  Once your pain is better controlled, switch back to just Tylenol.

## 2021-12-12 NOTE — ED Provider Notes (Signed)
[facility]   MRN: [id number] DOB: 22-Nov-1995  Subjective:   Johnny Nelson is a 26 y.o. male presenting for 5-day history of acute onset persistent coughing, chest congestion, wheezing when he lays down.  Patient smokes marijuana multiple times a week but not daily.  He has not been able to this past 5 days.  He also has had persistent right hand pain with swelling and bruising.  Patient states that this started happening after he slammed his hand down onto a hard surface.   Current Facility-Administered Medications:    Study - PURPOSE 2 - lenacapavir 309 mg/mL or placebo injection ([facility]), 927 mg, Subcutaneous, Once, Daiva Eves, Lisette Grinder, MD  Current Outpatient Medications:    Emtricitabine-Tenofovir AF (DESCOVY PO), Take by mouth., Disp: , Rfl:    Study - PURPOSE 2 - lenacapavir 300 mg or placebo tablet ([facility]), Take 2 tablets (600 mg total) by mouth daily., Disp: 4 tablet, Rfl: 0   No Known Allergies  History reviewed. No pertinent past medical history.   History reviewed. No pertinent surgical history.  Family History  Problem Relation Age of Onset   Multiple sclerosis Mother    Diabetes Maternal Grandmother    Hyperlipidemia Unknown     Social History   Tobacco Use   Smoking status: Never   Smokeless tobacco: Never  Substance Use Topics   Alcohol use: No   Drug use: No    ROS   Objective:   Vitals: BP 123/83   Pulse 80   Temp 97.7 F (36.5 C)   Resp 18   SpO2 97%   Physical Exam Constitutional:      General: He is not in acute distress.    Appearance: Normal appearance. He is well-developed and normal weight. He is not ill-appearing, toxic-appearing or diaphoretic.  HENT:     Head: Normocephalic and atraumatic.     Right Ear: Tympanic membrane, ear canal and external ear normal. There is no impacted cerumen.     Left Ear: Tympanic membrane, ear canal and external ear normal. There is no impacted cerumen.     Nose:  Nose normal. No congestion or rhinorrhea.     Mouth/Throat:     Mouth: Mucous membranes are moist.     Pharynx: No oropharyngeal exudate or posterior oropharyngeal erythema.  Eyes:     General: No scleral icterus.       Right eye: No discharge.        Left eye: No discharge.     Extraocular Movements: Extraocular movements intact.     Conjunctiva/sclera: Conjunctivae normal.  Cardiovascular:     Rate and Rhythm: Normal rate and regular rhythm.     Heart sounds: Normal heart sounds. No murmur heard.    No friction rub. No gallop.  Pulmonary:     Effort: Pulmonary effort is normal. No respiratory distress.     Breath sounds: Normal breath sounds. No stridor. No wheezing, rhonchi or rales.  Musculoskeletal:       Hands:     Cervical back: Normal range of motion and neck supple. No rigidity. No muscular tenderness.     Comments: Patient has 1+ swelling of the hand worse over the ulnar aspect.  There is tenderness across the 3rd-5th metacarpals.  Neurological:     General: No focal deficit present.     Mental Status: He is alert and oriented to person, place, and time.  Psychiatric:  Mood and Affect: Mood normal.        Behavior: Behavior normal.        Thought Content: Thought content normal.        Judgment: Judgment normal.     DG Hand Complete Right  Result Date: 12/12/2021 CLINICAL DATA:  Right hand pain after slamming fist onto hard surface. EXAM: RIGHT HAND - COMPLETE 3+ VIEW COMPARISON:  None Available. FINDINGS: There is an acute and comminuted fracture deformity involving the base of the fifth metacarpal bone. There is mild lateral displacement of the distal fracture fragments by approximately 2 mm. No definite intra-articular extension of the fracture identified. No additional fracture or dislocation identified. IMPRESSION: Acute and comminuted fracture deformity involves the base of the fifth metacarpal bone. Electronically Signed   By: Signa Kell M.D.   On:  12/12/2021 12:54    Patient was placed into an ulnar gutter splint.  Wrist was held in slight extension.  Assessment and Plan :   I have reviewed the PDMP during this encounter.  1. Viral respiratory illness   2. Subacute cough   3. Wheezing   4. Marijuana use   5. Closed displaced fracture of fifth metacarpal bone of right hand, unspecified portion of metacarpal, initial encounter    Emphasized need to wear the splint at all times for management of his fifth metacarpal fracture.  Tylenol for regular pain control, hydrocodone for breakthrough pain.  Follow-up with Dr. Merlyn Lot as soon as possible. Given his marijuana use will use an albuterol inhaler for the wheezing.  Otherwise, suspect viral URI, viral syndrome. Physical exam findings reassuring and vital signs stable for discharge. Deferred imaging given clear cardiopulmonary exam, hemodynamically stable vital signs. Advised supportive care, offered symptomatic relief. Counseled patient on potential for adverse effects with medications prescribed/recommended today, ER and return-to-clinic precautions discussed, patient verbalized understanding.     Wallis Bamberg, PA-C 12/12/21 1310

## 2021-12-12 NOTE — ED Triage Notes (Signed)
Pt here with persistent cough after having a URI 5 days ago. Pt also here after slamming fist onto hard surface and now has right hand and pinky pain x 5 days.

## 2021-12-18 DIAGNOSIS — S62346A Nondisplaced fracture of base of fifth metacarpal bone, right hand, initial encounter for closed fracture: Secondary | ICD-10-CM | POA: Diagnosis not present

## 2021-12-18 DIAGNOSIS — M25531 Pain in right wrist: Secondary | ICD-10-CM | POA: Diagnosis not present

## 2021-12-18 DIAGNOSIS — M79644 Pain in right finger(s): Secondary | ICD-10-CM | POA: Diagnosis not present

## 2022-01-12 ENCOUNTER — Encounter (INDEPENDENT_AMBULATORY_CARE_PROVIDER_SITE_OTHER): Payer: Self-pay

## 2022-01-12 ENCOUNTER — Other Ambulatory Visit: Payer: Self-pay

## 2022-01-12 VITALS — BP 107/70 | HR 76 | Temp 97.7°F | Resp 16 | Wt 140.7 lb

## 2022-01-12 DIAGNOSIS — Z006 Encounter for examination for normal comparison and control in clinical research program: Secondary | ICD-10-CM

## 2022-01-12 NOTE — Research (Signed)
Johnny Nelson seen for PK Tail Visit week 13 for Purpose 2, he is receiving open label Descovy. [phone] Purpose 2 is a double blind study evaluating the efficacy and safety of long acting lenacapavir for HIV preexposure. It is being compared to Truvada for PREP. For patient care concerns please call [phone]. All procedures carried out per study protocol. Will plan to see participant again in 13 weeks.

## 2022-01-13 LAB — RPR: RPR Ser Ql: NONREACTIVE

## 2022-04-12 ENCOUNTER — Encounter (INDEPENDENT_AMBULATORY_CARE_PROVIDER_SITE_OTHER): Payer: Self-pay

## 2022-04-12 ENCOUNTER — Other Ambulatory Visit: Payer: Self-pay

## 2022-04-12 VITALS — Wt 143.3 lb

## 2022-04-12 DIAGNOSIS — Z006 Encounter for examination for normal comparison and control in clinical research program: Secondary | ICD-10-CM

## 2022-04-12 NOTE — Research (Signed)
Individual seen for Visit Week 26 of PK Tail Phase of Purpose 2 study, [phone] Purpose 2 is a double blind study evaluating the efficacy and safety of long acting lenacapavir for HIV preexposure. It is being compared to Truvada for PREP. For patient care concerns please call [phone]. Overall, doing well, no medical concerns. Procedures carried out per study protocol. Study medications dispensed. Plan to see again in Feb, 2024 for Visit Week 39 PK Tail Phase.

## 2022-04-19 LAB — RPR: RPR Ser Ql: NONREACTIVE

## 2022-07-07 ENCOUNTER — Encounter (INDEPENDENT_AMBULATORY_CARE_PROVIDER_SITE_OTHER): Payer: Self-pay

## 2022-07-07 ENCOUNTER — Other Ambulatory Visit: Payer: Self-pay

## 2022-07-07 VITALS — BP 107/69 | HR 80 | Temp 98.4°F | Resp 16 | Wt 156.7 lb

## 2022-07-07 DIAGNOSIS — Z006 Encounter for examination for normal comparison and control in clinical research program: Secondary | ICD-10-CM

## 2022-07-07 MED ORDER — STUDY - PURPOSE 2 (OPEN-LABEL) - EMTRICITABINE/TENOFOVIR ALAFENAMIDE 200-25 MG (DESCOVY) TABLET (PI-VAN DAM): 1.0000 | ORAL_TABLET | Freq: Every day | ORAL | 0 refills | Status: DC

## 2022-07-07 NOTE — Research (Signed)
Individual seen for Visit Week 26 of PK Tail Phase of Purpose 2 study, [phone] Purpose 2 is a double blind study evaluating the efficacy and safety of long acting lenacapavir for HIV preexposure. It is being compared to Truvada for PREP. For patient care concerns please call [phone]. Re-consent obtained, explained/reviewed, risk, benefits, responsibilities and other options were reviewed. Questions answered, comprehension of the study was assessed and adequate time to consider options was provided. Individual verbalized understanding and signed the informed consent witnessed by study coordinator. Overall, doing well, no medical concerns. Procedures carried out per study protocol. Study medications dispensed. Plan to see again in 13 weeks for PK Tail Phase Week 52.

## 2022-07-08 LAB — RPR: RPR Ser Ql: NONREACTIVE

## 2022-10-14 ENCOUNTER — Encounter (INDEPENDENT_AMBULATORY_CARE_PROVIDER_SITE_OTHER): Payer: Self-pay

## 2022-10-14 ENCOUNTER — Other Ambulatory Visit: Payer: Self-pay

## 2022-10-14 DIAGNOSIS — Z006 Encounter for examination for normal comparison and control in clinical research program: Secondary | ICD-10-CM

## 2022-10-14 MED ORDER — STUDY - PURPOSE 2 (OPEN-LABEL) - EMTRICITABINE/TENOFOVIR ALAFENAMIDE 200-25 MG (DESCOVY) TABLET (PI-VAN DAM)
1.0000 | ORAL_TABLET | Freq: Every day | ORAL | 0 refills | Status: DC
Start: 2022-10-14 — End: 2023-07-13

## 2022-10-14 NOTE — Research (Signed)
Individual seen for PK Tail Week 52 for Purpose 2  [id number] Purpose 2 is a double blind study evaluating the efficacy and safety of long acting lenacapavir for HIV preexposure. It is being compared to Truvada for PREP. For patient care concerns please call [phone]. Overall individual is doing well. All procedures carried out per protocol. Study medications dispensed. Plan to see participant again in 13 weeks.

## 2022-10-15 LAB — RPR: RPR Ser Ql: NONREACTIVE

## 2022-11-04 ENCOUNTER — Telehealth: Payer: Self-pay | Admitting: Family Medicine

## 2022-11-04 NOTE — Telephone Encounter (Signed)
Pt states he used to see Dr.Lowne as a kid and and wanted to know if he could transfer back to her. Please advise.

## 2022-11-05 ENCOUNTER — Encounter: Payer: BC Managed Care – PPO | Admitting: Family Medicine

## 2022-11-08 NOTE — Telephone Encounter (Signed)
Lvm to sched.

## 2022-12-08 ENCOUNTER — Encounter (INDEPENDENT_AMBULATORY_CARE_PROVIDER_SITE_OTHER): Payer: Self-pay

## 2022-12-21 ENCOUNTER — Encounter: Payer: BLUE CROSS/BLUE SHIELD | Admitting: Family Medicine

## 2023-01-27 ENCOUNTER — Encounter: Payer: BLUE CROSS/BLUE SHIELD | Admitting: *Deleted

## 2023-07-13 ENCOUNTER — Other Ambulatory Visit: Payer: Self-pay | Admitting: Physician Assistant

## 2023-07-13 NOTE — Progress Notes (Signed)
 Discontinued IP

## 2023-07-18 ENCOUNTER — Encounter

## 2023-07-19 ENCOUNTER — Encounter

## 2023-07-19 ENCOUNTER — Other Ambulatory Visit: Payer: Self-pay

## 2023-07-19 VITALS — BP 119/78 | HR 76 | Temp 97.7°F | Resp 16 | Ht 75.0 in | Wt 146.2 lb

## 2023-07-19 DIAGNOSIS — Z006 Encounter for examination for normal comparison and control in clinical research program: Secondary | ICD-10-CM

## 2023-07-19 DIAGNOSIS — J302 Other seasonal allergic rhinitis: Secondary | ICD-10-CM

## 2023-07-19 LAB — POCT RAPID HIV: Rapid HIV, POC: NEGATIVE

## 2023-07-19 NOTE — Progress Notes (Signed)
 EXTEND [id number] Site: [id number] Screening Visit Subj ID: [id number] Date: 19 Jul 2023   [x]  []  Physical Exam Vitals reviewed.  Constitutional:      General: He is not in acute distress.    Appearance: Normal appearance. He is normal weight. He is not ill-appearing, toxic-appearing or diaphoretic.  HENT:     Head: Normocephalic and atraumatic.     Right Ear: Tympanic membrane normal.     Left Ear: Tympanic membrane normal.     Nose: Nose normal. No congestion or rhinorrhea.     Mouth/Throat:     Mouth: Mucous membranes are moist.     Pharynx: Oropharynx is clear. No oropharyngeal exudate or posterior oropharyngeal erythema.  Eyes:     General: No scleral icterus.       Right eye: No discharge.        Left eye: No discharge.     Extraocular Movements: Extraocular movements intact.     Conjunctiva/sclera: Conjunctivae normal.     Pupils: Pupils are equal, round, and reactive to light.  Cardiovascular:     Rate and Rhythm: Normal rate and regular rhythm.     Pulses: Normal pulses.     Heart sounds: Normal heart sounds. No murmur heard.    No friction rub. No gallop.  Pulmonary:     Effort: Pulmonary effort is normal. No respiratory distress.     Breath sounds: No wheezing, rhonchi or rales.  Chest:     Chest wall: No tenderness.  Abdominal:     General: Abdomen is flat. Bowel sounds are normal. There is no distension.     Palpations: Abdomen is soft. There is no mass.     Tenderness: There is no abdominal tenderness. There is no guarding or rebound.     Hernia: No hernia is present.  Musculoskeletal:        General: No swelling or tenderness. Normal range of motion.     Cervical back: Normal range of motion and neck supple. No rigidity or tenderness.     Right lower leg: No edema.     Left lower leg: No edema.  Lymphadenopathy:     Cervical: No cervical adenopathy.  Skin:    General: Skin is warm and dry.     Capillary Refill: Capillary refill takes less than 2 seconds.      Coloration: Skin is not jaundiced or pale.     Findings: No bruising, erythema, lesion or rash.  Neurological:     General: No focal deficit present.     Mental Status: He is alert and oriented to person, place, and time.     Cranial Nerves: No cranial nerve deficit.     Motor: No weakness.     Coordination: Coordination normal.     Gait: Gait normal.     Deep Tendon Reflexes: Reflexes normal.  Psychiatric:        Mood and Affect: Mood normal.        Behavior: Behavior normal.        Thought Content: Thought content normal.        Judgment: Judgment normal.

## 2023-07-19 NOTE — Research (Addendum)
 EXTEND [id number] Site: [id number] Screening Visit Subj ID: [id number] Date: 11Mar2025  Yes No Completed: Consent   [x]  []  Participant Identification Verified, Copy Driver's License or ID  [x]  []  Informed Consent (main study):   [x] Explained/Questions answered  [x] Other options reviewed  [x] Participant given adequate time to consider options [x] Comprehension assessed [x] Assess participant's ability to read  [x] Obtained in a language the subject understood        Note interpreter's name, if applicable  [x] Study Medication risks list reviewed with subject [x] Responsibilities of subject reviewed [x] Correct version & version date (ENGLISH)  [] Correct version & version date (SPANISH)  [x] Signed & witnessed prior to screening procedures: Date: 11Mar2025    Time: 1524 [x] Copy to participant: Date: 11Mar2025     [x] Verify version & version date: Version: 19Nov2024    Date: Revised 19Nov2024  Consent for Further Research RELATED TO THIS STUDY:   [] Yes     [x] No  Consent for Further Research NOT RELATED TO THIS STUDY:   [] Yes     [x] No  Consent completed by: Arvilla Meres, PA-C     Yes No Completed:  [x]  []  Verify participant identity before doing any study procedures     [x]  []  eCSSRs: Any positive (abnormal) response indicating SIB or any unusual changes in behavior, confirmed by the investigator will result in their discontinuation of study and /or study intervention, the PI/SI will arrange for urgent specialist psychiatric evaluation and management  [x]  []  Register Screening Visit in [facility]  [x]  []  Eligibility Criteria Review (Inclusion/Exclusion Criteria)    [x]  []  RAPID HIV TESTING  [] REACTIVE or [x] NON REACTIVE  If REACTIVE, Participant referred for further testing and clinical management as per local standard of care   [x]  []  Review Acute HIV symptoms: Participants with signs and symptoms suggestive of acute HIV infection in the last 14 days prior to Screening will be excluded.   [x]  None  Reported  [x]  []  Demographic Data Obtained:  DOB: 1995-05-19  Sex at birth: male Gender identity: male  Patient Race:Black or African American [2] Not Hispanic or Latino    [x]  []   Past Medical History:  Diagnosis Date Diagnosed   History of stomach ulcers 2021   Resolved 2021   Seasonal allergies 2008    [x]  []  Patient Active Problem List   Diagnosis Date Diagnosed    DRY SKIN 11/11/2009        Seasonal allergies 2008     [x]  []  Premature cardiovascular disease: male participant <65 years or male participant <55 years in first degree relatives only. [x] No   [] Yes  [x]  []  Substance Usage Review Tobacco Use: Low Risk  (07/26/2023)   Patient History    Smoking Tobacco Use: Never    Smokeless Tobacco Use: Never    Passive Exposure: Never     Social History   Substance and Sexual Activity  Sexual Activity Yes   Partners: Male   Birth control/protection: Condom   Comment: Anal and Orax sex only     Social History   Substance and Sexual Activity  Alcohol Use Not Currently   Comment: Started in 2017,    Social History   Substance and Sexual Activity  Drug Use No   Comment: Prior THC, Started 2014, stopped 01Feb2025    Caffeine: 1 caffeine containing beverage/day  [x]  []  Concomitant Medication Review and Vaccination history  Current Meds Start Date  Medication     cetirizine (ZYRTEC ALLERGY) 10 MG tablet Take 1 tablet (10 mg total) by mouth daily. 05Aug2023     [  x] []  Contraception Review: Report in eCRF as applicable Male of childbearing potential: Must agree to use a highly effective method of contraception consistently and correctly, must also continue to use adequate contraception methods for at least 52 weeks after the last injection. Male condoms must be used in addition to hormonal contraception Sexual Abstinence: considered a highly effective method only if defined as refraining from heterosexual intercourse during the entire period of risk associated with  the study intervention.  The reliability of sexual abstinence needs to be evaluated in relation to the duration of the study and the preferred and usual lifestyle of the participant.  Condom use, no male partners  [x]  []  Triplicate 12-lead ECG: (perform prior to blood draw and injections) Perform in a semi-supine position after 5 minutes of rest.   3 individual ECG tracings should be obtained as closely as possible in succession, but no more than 2 minutes apart within 1 hour prior to first dose, The full set of triplicates should be completed in less than 6 minutes. If ECG abnormal clinically significant  then report as an AE/SAE.  Rest Start Time: 1611 Start Time: ECG #1  1616  ECG #2: 1617    ECG #3: 1617  [x]  []  Vitals:   07/19/23 1625  BP: 119/78  Pulse: 76  Resp: 16  Temp: 97.7 F (36.5 C)  TempSrc: Oral  Weight: 146 lb 2.6 oz (66.3 kg)  Height: 6\' 3"  (1.905 m)   Height 75 in. (Equivalent to above)  Body mass index is 18.27 kg/m.    Vital rest time: 1620  [x]  []  Horton Finer, PA-C  Physician Assistant Certified Specialty: Infectious Diseases   Progress Notes    Sign when Signing Visit   Encounter Date: 07/19/2023         EXTEND [id number] Site: [id number] Screening Visit Subj ID: [id number] Date: 19 Jul 2023     [x]   []   Physical Exam Vitals reviewed.  Constitutional:      General: He is not in acute distress.    Appearance: Normal appearance. He is normal weight. He is not ill-appearing, toxic-appearing or diaphoretic.  HENT:     Head: Normocephalic and atraumatic.     Right Ear: Tympanic membrane normal.     Left Ear: Tympanic membrane normal.     Nose: Nose normal. No congestion or rhinorrhea.     Mouth/Throat:     Mouth: Mucous membranes are moist.     Pharynx: Oropharynx is clear. No oropharyngeal exudate or posterior oropharyngeal erythema.  Eyes:     General: No scleral icterus.       Right eye: No discharge.        Left eye: No discharge.      Extraocular Movements: Extraocular movements intact.     Conjunctiva/sclera: Conjunctivae normal.     Pupils: Pupils are equal, round, and reactive to light.  Cardiovascular:     Rate and Rhythm: Normal rate and regular rhythm.     Pulses: Normal pulses.     Heart sounds: Normal heart sounds. No murmur heard.    No friction rub. No gallop.  Pulmonary:     Effort: Pulmonary effort is normal. No respiratory distress.     Breath sounds: No wheezing, rhonchi or rales.  Chest:     Chest wall: No tenderness.  Abdominal:     General: Abdomen is flat. Bowel sounds are normal. There is no distension.     Palpations: Abdomen is soft. There is no  mass.     Tenderness: There is no abdominal tenderness. There is no guarding or rebound.     Hernia: No hernia is present.  Musculoskeletal:        General: No swelling or tenderness. Normal range of motion.     Cervical back: Normal range of motion and neck supple. No rigidity or tenderness.     Right lower leg: No edema.     Left lower leg: No edema.  Lymphadenopathy:     Cervical: No cervical adenopathy.  Skin:    General: Skin is warm and dry.     Capillary Refill: Capillary refill takes less than 2 seconds.     Coloration: Skin is not jaundiced or pale.     Findings: No bruising, erythema, lesion or rash.  Neurological:     General: No focal deficit present.     Mental Status: He is alert and oriented to person, place, and time.     Cranial Nerves: No cranial nerve deficit.     Motor: No weakness.     Coordination: Coordination normal.     Gait: Gait normal.     Deep Tendon Reflexes: Reflexes normal.  Psychiatric:        Mood and Affect: Mood normal.        Behavior: Behavior normal.        Thought Content: Thought content normal.        Judgment: Judgment normal.             Yes No Labs Collection Information: Sent to [facility]  [x]  []  Verified previous Hgb to be >= 10 (if value <10, adjust volume per unit SOP) [x] NA, healthy  volunteer or previous Hgb results are unavailable  [x]  []  Participant reports non-study blood collection in the previous 2 onths:      [] Yes, estimated vol. _________mL     [x] No  [x]  []  Is the Participant Fasting:  Not required at Screening [] Yes, Date/Time of last intake:  [x] No/Not required     [x]  []  Blood Drawn.    Challenges:  [x] No   [] Yes, describe:    Time 1635 Total Vol 38 mL  [x]  []  Chemistry  [x]  []  Hematology  [x]  []  Non rapid HIV immunoassay  [x]  []  HIV-1 RNA   [x]  []  Neisseria gonorrhea (GC), Chlamydia trachomatis (CT), Trichomononas vaginalis (TV), syphilis.  If positive we will refer for treatment as per local guidelines.  Is participant experiencing any symptomatic STIs currently? [] Yes [x] No [x] GC/CT NAAT urine or vaginal swab  Rectal and oropharyngeal swab can be collected based on reported sexual history. [x] GC and CT: Rectal Swab   [] N/A [x] Oropharyngeal Swab   []  N/A [] TV: vaginal Swab   [x]  N/A  *If male and menstruating may take sample at next visit  [x]  []  Coagulation Tests   [x]  []  Hepatitis B&C   [x]  []  Plasma for Storage  [x]  []  Urine Drug Toxicology  []  [x]  Urine pregnancy Test  ([facility] only)      [x] N/A, not a POCBP  []  [x]  Serum Pregnancy Test  ([facility] only)       [x] N/A, not a POCBP   Yes No  Completed:  [x]  []  HIV/STI  Counseling/Condom Distribution [x] Accepted  [] Declined  [x]  []  AE/SAE Review  [x]  []  Assess for Protocol Deviations  [x]  []  Reminders:  Participants of childbearing potential agree to use a highly effective method of contraception consistently and correctly HIV vaccines are not permitted at any time during the study No  other experimental agents, antiretroviral drugs, cytotoxic chemotherapy, or radiation therapy may be administered throughout the trial No systemically administered immunomodulators permitted Notify study staff if you experience any signs/symptoms, visit a provider in ER or at a scheduled evaluation, or start any new  OTC, supplemental or prescription therapies  [x]  []  Compensation:  1.You will receive a payment of $78.00 at the end of each non dosing visit completed to reimburse you for your time and effort.  2. You will receive $125.00 at the end of each dosing visit completed to reimburse you for your time and effort.   3. If you must return to the clinic for an unscheduled visit, you will receive $78.  Amount: $78.00[x][]  Schedule Entry Visit within 30 days. Participant must be fasting for entry.  Date: 19Mar2025    Time: 0900

## 2023-07-20 ENCOUNTER — Encounter

## 2023-07-26 NOTE — Research (Signed)
 ELIGIBILITY CHECKLIST EXTEND [id number] Protocol Amendment 2 [facility] Research Site: [id number]     4 Letter Code: MCEA Subject ID #: [id number]   YES NO Inclusion Criteria: Participants are eligible to be included in the study only if all of the following criteria apply  [x]  []  At the time of obtaining informed consent, adolescent and adult participants weighing at least 35 kg.    [x]  []  Participants must have a nonreactive HIV test at Screening (rapid test, nonrapid HIV immunoassay, and HIV RNA) and enrollment (a rapid test, nonrapid HIV immunoassay, and HIV RNA). Note: All HIV test results from the Screening visit must be obtained and must all be negative/non-reactive. In addition, at least one HIV test result using blood drawn at the Enrollment visit must be obtained prior to randomization into the study and must be negative/non-reactive. Individuals who have one or more reactive or positive HIV test result(s) at Screening or at Day 1 before dosing will not be enrolled, even if subsequent confirmatory testing does not confirm HIV infection. Those with any enrollment HIV test result after dosing that is reactive will proceed through the HIV algorithm but will discontinue study product regardless of subsequent test results.  [x]  []  Participants who are at risk of acquiring HIV, defined as having had anal or vaginal sex in the past 6 months.  [x]  []  Participants who are overtly healthy as determined by medical evaluation by a responsible and experienced physician, including medical history, physical examination, laboratory tests and cardiac monitoring at the time of screening.  Note: A participant with a significant clinical abnormality or laboratory parameter(s) which is/are not specifically listed in the inclusion or exclusion criteria, outside the reference range for the population being studied may be included if the investigator determines and documents that the finding is unlikely to introduce additional risk  factors and will not interfere with the study procedures. A single repeat of a procedure or lab parameter is allowed to determine eligibility.  [x]  []  No alcohol or substance use that, in the opinion of the study investigator and medical monitor, would interfere with the conduct of the study (e.g., provided by self-report, or found upon medical history and examination or in available medical records).  [x]  []  Participants who have received oral PrEP (TDF/FTC; TAF/FTC), are eligible, but they must discontinue oral PrEP within 10 days of Day 1 visit.  [x]  []  Male or male at birth (transgender individuals are not excluded). Contraceptive use by men and women should be consistent with local regulations regarding the methods of contraception for those participating in clinical studies. Participants assigned male at birth are eligible to participate if they are not pregnant or breastfeeding, and at least one of the following conditions applies: Is not a person of childbearing potential (POCBP) as defined in Section [phone]: Contraception and Research officer, political party. OR Is a POCBP and using a contraceptive method that is highly effective, with a failure rate of <1%, as described in Section 11.4.2 from the time of screening and inclusive of the entire time while on study. The investigator should evaluate the effectiveness of the contraceptive method in relationship to the first dose of study intervention. A POCBP must have a negative highly sensitive pregnancy test (urine and serum as required) within the 30 days before the dose of study intervention.  Additional requirements for pregnancy testing during and after study intervention are located in Section 8.4.4, Pregnancy Testing. The investigator is responsible for review of medical history, menstrual history, and recent sexual activity to  decrease the risk for inclusion of a woman with an early undetected pregnancy.  [x]  []  Participants must be >29 years of age,  be of legal age to consent to sexual intercourse and capable of giving written informed consent, which includes compliance with the requirements and restrictions listed in the informed consent form (ICF) and in this protocol. For adolescents:  Willing to provide written informed assent/consent for the study and/or able to obtain written parental/guardian informed consent;  If not of legal age or otherwise not able to provide independent informed consent as determined by site SOPs and consistent with site IRB/EC policies and procedures: Parent or legal guardian is willing and able to provide written informed consent for study participation and potential participant is willing and able to provide written assent for study participation.  If of legal age or otherwise able to provide independent informed consent as determined by site SOPs and consistent with site IRB/EC policies and procedures: Willing and able to provide written informed consent for study participation.   YES NO Exclusion Criteria: Participants are excluded from the study if any of the following criteria apply  []  [x]  One or more reactive or positive HIV test results at Screening or Enrollment, even if HIV infection was not confirmed.   []  [x]  Participants who are breastfeeding or plan to become pregnant or breastfeed during the study.   []  [x]  Alanine aminotransferase (ALT) >=3 times the upper limit of normal (ULN).   []  [x]  Evidence of active Hepatitis B virus (HBV) infection based on the results of testing at Screening for Hepatitis B surface antigen (hBsAg), Hepatitis B core antibody (anti-HBc), Hepatitis B surface antibody (anti-HBs) and HBV DNA as follows:  Participants positive for HBsAg or HBV DNA are excluded.  Participants negative for anti-HBs but positive for anti-HBc (negative HBsAg status) and positive for HBV DNA are excluded.  Note: Participants positive for anti-HBc (negative HBsAg status) and positive for anti-HBs (past  and/or current evidence) are immune to HBV and are not excluded.   []  [x]  Unstable liver disease (as defined by any of the following: presence of ascites, encephalopathy, coagulopathy, hypoalbuminemia, esophageal or gastric varices, or persistent jaundice or cirrhosis), known biliary abnormalities (with the exception of Gilbert's syndrome or asymptomatic gallstones or otherwise stable chronic liver disease per investigator assessment).    []  [x]  History of clinically relevant hepatitis within last 6 months.   []  [x]  Known history of liver cirrhosis with or without viral hepatitis co-infection.   []  [x]  Participants with HCV co-infection will be allowed entry into this study if:   Liver enzymes meet entry criteria.  HCV Disease has undergone appropriate work-up, and is not advanced, and will not require treatment prior to the primary endpoint (e.g., Month 13). Additional information (where available) on participants with HCV co-infection at screening should include results from any liver biopsy, Fibroscan, ultrasound, or other fibrosis evaluation, history of cirrhosis or other decompensated liver disease, prior treatment, and timing/plan for HCV treatment.   In the event that recent biopsy or imaging data is not available or inconclusive, the Fib-4 score will be used to verify eligibility:  i.  Fib-4 score >3.25 is exclusionary.   ii. Fib-4 scores 1.45 - 3.25 requires Medical Monitor consultation.   Fibrosis 4 score Formula: (Age x AST) / (Platelets x (sqr [ ALT])  It is approved by the Medical Monitor.   []  [x]  Creatinine clearance of <30 mL/min/1.73 m2 via CKD-EPIcr_R (2021) method.   []  [x]  Any acute laboratory abnormality  at Screening, which, in the opinion of the investigator, would preclude the participant's participation in the study of an investigational compound.   []  [x]  Any verified Grade 4 laboratory abnormality, with the exception of Grade 4 [triglycerides or lipid abnormalities]. A single  repeat test is allowed during the Screening period to verify a result.   []  [x]  Participants determined by the Investigator to have a high risk of seizures, including participants with an unstable or poorly controlled seizure disorder. A participant with a prior history of seizure may be considered for enrolment if the Investigator believes the risk of seizure recurrence is low. All cases of prior seizure history should be discussed with the Medical Monitor prior to enrolment.   []  [x]  Participant is currently participating in or has participated in a study with a compound or device that is not commercially available within 30 days of signing informed consent, unless permission from the Medical Monitor is granted.   []  [x]  Presence of any history of allergy/sensitivity to any of the study drug.    []  [x]  Inflammatory skin conditions that compromise the safety of IM injections, per the discretion of the investigator. Mild skin conditions may not be exclusionary at the discretion of the investigator or designee.    []  [x]  Participant has an implant/enhancement (including fillers) at the area of proposed injection (e.g., gluteus medius); or tattoo or other dermatological condition overlying the area for IM (e.g., gluteus medius) or any other area which may significantly interfere with interpretation of injection site reactions.    []  [x]  Current or anticipated need for chronic anti-coagulants or active coagulopathy (primary or iatrogenic) which would contraindicate IM injection.    []  [x]  Ongoing or clinically relevant pancreatitis.   []  [x]  Clinically significant cardiovascular disease or history of clinically significant cardiovascular disease, as defined by history/evidence of congestive heart failure, symptomatic arrhythmia, myocardial infarction, documented hypertrophic cardiomyopathy, sustained ventricular tachycardia, angina/ischemia, coronary artery bypass grafting (CABG) surgery or percutaneous  transluminal coronary angioplasty (PTCA) or any clinically significant cardiac disease.   []  [x]  All participants will be screened for STIs (e.g., chlamydia, gonorrhea, trichomoniasis, syphilis). Participants with untreated infections are excluded. Participants may be rescreened at least 24 hours after completion of STI treatment.    *Untreated syphilis infection includes positive syphilis testing at Screening without clear documentation of treatment. Participants who are at least 24 hours post completed successful treatment are eligible.   []  [x]  History or presence of sensitivity to any of the study medications or their components or drugs of their class, or a history of drug or other allergy that, in the opinion of the investigator or Medical Monitor, contraindicates their participation. In addition, if heparin is used during PK sampling, subjects with a history of sensitivity to heparin or heparin-induced thrombocytopenia should not be enrolled.   []  [x]  Ongoing uncontrolled malignancy is excluded, whereas participants who have controlled localized malignancies may be included on agreement between the investigator and the Medical Monitor.   []  [x]  Participants who in the investigator's judgment, poses a significant suicidality risk. Participant's history of suicidal behavior and/or suicidal ideation should be considered when evaluating for suicide risk.   []  [x]  Any pre-existing physical or mental condition (including substance abuse disorder) which, in the opinion of the Investigator or the medical monitor, may interfere with the participant's ability to comply with the dosing schedule and/or protocol evaluations or which may compromise the safety of the participant.   []  [x]  Any condition which, in the opinion of  the Investigator or the medical monitor, may interfere with the absorption, distribution, metabolism or excretion of the study drugs or render the participant unable to receive IM medication.    []  [x]  Co-enrollment in any other interventional research study or other concurrent studies which may have interfered with this study (as provided by self-report or other available documentation). Exceptions for non-interventional studies may be made if appropriate after consultation with the Medical Monitor.   []  [x]  Participants receiving any protocol-prohibited medication and who are unwilling or unable to switch to an alternate medication.    []  [x]  Use of ART for Postexposure Prophylaxis within the 90 days prior to Day 1.   []  [x]  Use of CAB LA for PrEP at any time prior to Screening.   []  [x]  Exposure to an experimental drug or experimental vaccine within either 28 days, 5 half-lives of the test agent, or twice the duration of the biological effect of the test agent, whichever is longer, prior to the first dose of IP.   []  [x]  Anticipated need for HCV therapy with interferon or any drugs that have potential for adverse drug:drug interactions with study treatment throughout the entire study period.    []  [x]  Treatment with an HIV-1 preventive vaccine within 90 days of Screening.   []  [x]  Participant is unlikely to adhere to the study procedures, keep appointments, is planning to relocate during the study, or remain on study through to its conclusion.    []  [x]  Participants who are considered high-risk, meeting at least one of the following criteria: Participant who has exchanged condomless sex for goods or money within the past 12 months prior to Screening. Participant who has used recreational intravenous drugs within the past 12 months prior to Screening. Participant who has participated in [employer]* practice (such as the use of cocaine, crack cocaine, methamphetamine, ketamine, 3,4-methlenedioxy-methamphetamine, GHB, mephedrone or prescription drugs apart from those prescribed by a licensed provider) within the past 6 months prior to Screening. Participant with any STI in the last 6 months also  reporting any partners for condomless sex. Participant who has condomless sex with a serodiscordant partner who has a detectable viral load or is not on ART. Participant with any other behavior assessed by the investigator as high-risk sexual behavior.   *Chemsex is defined as the voluntary intake of psychoactive and non-psychoactive drugs (such as ecstasy, cocaine, crack cocaine, methamphetamine, ketamine, 3,4-methylenedioxy-methamphetamine, GHB, mephedrone or prescription drugs apart from those prescribed by a licensed provider) in the context of recreational settings to facilitate and/or to enhance sexual intercourses.  []  [x]  Participant has in the last 14 days prior to Screening presented with signs and symptoms, which, in the opinion of the investigator, are suggestive of acute HIV infection. Participants may only be enrolled if clinical suspicion of HIV is ruled out with non-reactive results.   []  [x]  Participant becomes a ward of state (e.g., child in care).     By signing this Eligibility Document, I have reviewed the above criteria and find the individual Is: Eligible to participate in EXTEND [id number].  Any clinically significant changes occurring between PI sign off and date of entry have been reviewed with the PI.

## 2023-07-26 NOTE — Research (Signed)
 Marland Kitchen

## 2023-07-27 ENCOUNTER — Other Ambulatory Visit: Payer: Self-pay

## 2023-07-27 ENCOUNTER — Encounter: Admitting: *Deleted

## 2023-07-27 VITALS — BP 108/72 | HR 74 | Temp 98.0°F | Resp 16 | Wt 146.4 lb

## 2023-07-27 DIAGNOSIS — Z006 Encounter for examination for normal comparison and control in clinical research program: Secondary | ICD-10-CM

## 2023-07-27 MED ORDER — STUDY - EXTEND - CABOTEGRAVIR LA (GSK1265744) 600MG/3ML IM INJECTION (PI-VAN DAM)
600.0000 mg | INJECTION | Freq: Once | INTRAMUSCULAR | Status: AC
  Administered 2023-07-27: 600 mg via INTRAMUSCULAR
  Filled 2023-07-27: qty 3

## 2023-07-27 NOTE — Research (Unsigned)
 Study: [id number]/EXTEND Visit: DAY 1 Site: [phone] Subject ID: [id number] Letter Code: Johnny Nelson 19Mar2025  Assessments should occur in the following order: . eCSSRs . All other questionnaires . Triplicate 12-lead ECG . Vital signs . Blood draws (clinical laboratory tests, pre-dose pharmacokinetics, etc) . IP injection . Post-dose pharmacokinetics (if required)  Yes No Completed:  [x]  []  Verify participant identity before doing any study procedures  [x]  []  Verify Current Consent Version  [x]  []  Confirm Eligibility Verification (Inclusion/Exclusion Criteria) Have you used ART for PEP since screening or in the last 90 days? [] Yes [x] No Confirm all prior HIV results are negative from screening and rapid HIV result from today are negative   [x] Confirmed  [x]  []  Register Day 1 Visit in [facility]   Yes No Patient Reported Outcome Assessment (Questionnaires):  Must be completed before other assessments take place at each designated visit. In the order listed below. Completed electronically.  [x]  []  eCSSRs: Any positive  (abnormal) response indicating SIB or any unusual changes in behavior, confirmed by the investigator will result in their discontinuation of study intervention, the PI/SI will arrange for urgent specialist psychiatric evaluation and management  [x]  []  Social Determinants of Health Questionnaire (SDoH)  [x]  []  HIV-risk and PrEP use Anxiety and Stigma Questionnaire prior to the study drug administration   [x]  []  Sexual Health Practices Questionnaire-administer piror to study drug administration   [x]  []  Anxiety GAD-7 Questionnaire   Yes No Completed:  [x]  []  Has there been any changes to Medical History or have you experienced any new or worsening/improving symptoms since last screening visit? [] Yes [x] No  [x]  []  Review Concomitant Medications  [x]  []  Contraception Review: Report in eCRF Male of childbearing potential: Must agree to use a highly effective method of contraception  consistently and correctly, must also continue to use adequate contraception methods for at least 52 weeks after the last injection. Male condoms must be used in addition to hormonal contraception Sexual Abstinence: considered a highly effective method only if defined as refraining from heterosexual intercourse during the entire period of risk associated with the study intervention.  The reliability of sexual abstinence needs to be evaluated in relation to the duration of the study and the preferred and usual lifestyle of the participant. No Male partners   Yes No Completed:  [x]  []  Triplicate 12-lead ECG: (perform prior to blood draw and injections) Perform in a semi-supine position after 5 minutes of rest.  3 individual ECG tracing should be obtained as closely as possible in succession, but no more than 2 minutes apart within 1 hour prior to first dose, The full set of triplicates should be completed in less than 6 minutes. If ECG abnormal clinically significant  then report as an AE/SAE.  Rest Start Time: 0928 Start Time  ECG #1 : 0933     ECG #2: 0933     ECG #3: 0934   [x]  []  Height: (only at screening if adult) Weight: 66.4 kg Remove footwear and all heavy objects. Note: weight in kilograms to 1 decimal place in the eCRF.  BMI: Body mass index is 18.3 kg/m.  [x]  []  Vital Signs: RR,Temp, BP, HR: measure in semi-supine position after 5 minutes of rest  Rest Start Time: 0934  Vital Sign Start time: 0940 Vitals:   07/27/23 0940  BP: 108/72  Pulse: 74  Resp: 16  Temp: 98 F (36.7 C)     Yes No Completed:  [x]  []  Symptom Directed Physical Exam: A brief physcial examination will  include, at a minimum, assessments of the skin, lungs, cardiovasculat system, and abdomen (liver and spleen.) Physical Exam Constitutional:      Appearance: Normal appearance.  Cardiovascular:     Rate and Rhythm: Normal rate and regular rhythm.  Pulmonary:     Effort: Pulmonary effort is normal.      Breath sounds: Normal breath sounds.  Abdominal:     General: Abdomen is flat. Bowel sounds are normal.     Palpations: Abdomen is soft.     Tenderness: There is no abdominal tenderness.  Skin:    General: Skin is warm and dry.  Neurological:     Mental Status: He is alert.  Psychiatric:        Mood and Affect: Mood normal.        Thought Content: Thought content normal.        Judgment: Judgment normal.  No new complaints   Yes No Blood Draw Assessment Completed:  [x]  []  Verified previous Hgb to be >= 10 (if value <10, adjust volume per unit SOP)        [] N/A, healthy volunteer or previous Hgb results are unavailable  [x]  []  Participant reports non-study blood collection in the previous 2 months?                                                       []  Yes, estimated vol.  mL    [x] No  [x]  []  Is the Participant Fasting: [x] Yes, Date/Time of last intake:  18Mar2025 1700  [] No/Not required    Overnight fast is preferred; however, a minimum of a 6-hour fast is acceptable for participants with afternoon appointments. Encouraged that participants receive a snack following required study assessments and prior to a scheduled injection Snack provided? [x] Yes  [] No  [x]  []  Blood Drawn.   Problems?   [x]  No   [] Yes, describe  Time: 0953 Total Vol: 39 mL   Yes No Lab Collection Completed:  [x]  []  RAPID HIV TESTING [] REACTIVE or [x] NON REACTIVE  If positive/reactive participant must be withdrawn from the study even if subsequent confirmatory testing is negative. If positive, refer for further testing and clinical management as per local standard of care  Is participant experiencing any signs or symptoms consistent with acute HIV infection:   [x] No [] Yes; If yes, participant will not be enrolled unless acute HIV infection can be ruled out and consultation with Medical Monitor  [x]  []  Non rapid HIV immunoassay  [x]  []  HIV RNA   [x]  []  Neisseria gonorrhea (GC), Chlamydia trachomatis (CT),  Trichomononas vaginalis (TV), syphilis.  If positive we will refer for treatment as per local guidelines.  Is participant experiencing any symptomatic STIs currently? [] Yes [x] No  GC/CT NAAT urine/vaginal swab [x] Yes GC and CT: Rectal Swab [x]  Yes Oropharyngeal Swab [x]  Yes TV: vaginal Swab [x]  N/A  *If male and menstruating may take sample at next visit  [x]  []  PK Sampling for CAB on Day 1: Pre dose: </=1 hour before injection  [x]  []  Clinical chemistry (inc lipid panel): Fasting  [x]  []  Hematology  [x]  []  Coagulation testing  [x]  []  Plasma for Storage  [x]  []  Urinalysis (morning specimen preferred)      Time of Collection: 0905   []  N/A, not clinically indicated      LMP: _____________  [x] N/A, male or  NPOCBP  []  [x]  Urine pregnancy test for POCBP only      Time:      [x] N/A, not a POCBP *If positive perform a serum test to confirm.  Results must be available prior to administering CAB LA injections.    Yes No Study Treatment:     CAB LA [x]                                                [] N/A  [x]  []  Medication Verification: Confirm receipt of CAB LA, confirmed medication label against medication order, temperature recorded and scanned into [facility].     [x]  []  Administer Cabotegravir 600mg /3ml IM in ventrogluteal muscle: if BMI>/=30 requires longer needle length 2"; gauge can be 21-25 Lot Number: [id number]  Prep Time:    Expiration Time: 1138 Location: Left [] / Right [x] Ventrogluteal Muscle      Needle Length: 1.5" Needle Gauge: 21 BMI: Body mass index is 18.3 kg/m.   Time of administration: 1011  Day 1: Initial injection of CAB LA Month 1: Second injection of CAB LA  Month 3: First injection of CAB ULA  Months 5,9,13,17,21,25, and 29: Q4M maintenance of CAB ULA  [x]   []  ISR Review completed, ISR photography necessary  [] Yes   [x] No *Digital photographs will be documented at all visits on all participants who have an injection site reaction that is a visible, persistent Grade 2  (no improvement/resolution in >10 days), Grade >/=3, or serious.   Yes No Blood Draw Assessment Completed:  [x]  []  PK CAB LA 600 mg  POST DOSE approximately 2 hours after injection (+/-30 minute   collection window)  Dosing Time: 1011  PK Draw Time: 1204    Yes No Completed:  [x]  []  Assess for AEs, SAEs: All AEs (from Day 1 onwards) and SAEs (from Screening onwards) must be recorded in eCRF at all visits and reported within required timelines.  [x]  []  Assess for Protocol Deviations  [x]  []  HIV/STI Counseling/Condom Distribution  [x]  []  Reminders:   1. Participants of childbearing potential agree to use a highly effective method of contraception consistently and correctly 2. HIV vaccines are not permitted at any time during the study 3. No other experimental agents, antiretroviral drugs, cytotoxic chemotherapy, or radiation therapy my not be administered throughout the trial 4. No systemically administered immunomodulator's permitted 5. Notify study staff if you experience any signs/symptoms, visit provider in ER or scheduled evaluation, or start any new OTC, supplemental or prescription therapies  [x]  []  Compensation:  You will receive a payment of $78.00 at the end of each non dosing visit completed to reimburse you for your time and effort.  You will receive $125.00 at the end of each dosing visit completed to reimburse you for your  time and effort.   If you must return to the clinic for an unscheduled visit, you will receive $78.  Amount: $125.00[x][]  Schedule Next Visit:Visits should be planned using a calendar months and scheduled based on the date of the Day 1 injection.    Date: 27Mar2025    Time: 1330    Version 1.1 24Feb2025 JLS

## 2023-08-04 ENCOUNTER — Encounter

## 2023-08-04 NOTE — Research (Addendum)
 Eartha Gold [id number]                                                  Protocol Amendment 02  [facility] Research Site:  [phone]                                                                  TREATMENT PHASE  Visit: [x]  Day1+1W     []  M1    []  M1+1W    []  M2    []  M3    []  M3+1W    []  M4    [] M5     [] M5+1W   [] M6   [] M6+2W   [] M7   [] M7+2W   [] M8   [] M8+2W   [] M9 [] M9+1W   [] M10   [] M11   [] M12   [] M13   [] M15    [] M17   [] M19   [] M21   [] M23   [] M25   [] M27   [] M29   [] M31   [] M33   [] ED  [] 4 wk followup (if not entering LTFU)   [] LTFU M1   [] LTFU M4   [] LTFU M8   [] LTFU M12     Date: 27MAR2025         4 Letter Code: MCEA Subject ID: [id number]  Assessments should occur in the following order:  eCSSRs  All other questionnaires  Triplicate 12-lead ECG  Vital signs  Blood draws (clinical laboratory tests, pre-dose pharmacokinetics, etc)  IP injection  Post-dose pharmacokinetics (if required)  Yes No Completed:  [x]  []  Verify participant identity before doing any study procedures   All Visits  [x]  []  Verify correct version of ICF is signed   All Visits   Yes No Questionnaires Completed: Patient Reported Outcome Assessment must be completed before other assessments take place at each designated visit. Completed electronically by participants.  []  [x]  eCSSRs    Visits: M1, M3, M5, M9, M13, M17, M21, M25, M29, M33, ED Any positive  (abnormal) response indicating SIB or any unusual changes in behavior, confirmed by the investigator will result in their discontinuation of study intervention, the PI/SI will arrange for urgent specialist psychiatric evaluation and management  []  [x]  Social Determinants of Health Questionnaire (SDoH)     Visits: M17, ED  []  [x]  Study Medication Satisfaction Questionnaire (SMSQs)     Visits: M1, M3, M5, M9, M13, M17, M21, M25, M29, M33, ED  []  [x]  PrEP Preference and Rationale Questionnaire (only if prior exposure to oral PrEP)     Visits: M9, M17, M25, M33, ED  []  [x]   Acceptability of Injection Questionnaire     Visits: M1, M3, M5, M9, M13, M17, M21, M25, M29, M33, ED  []  [x]  Generalized Anxiety Disorders (GAD-&) Questionnaire     Visits: M1, M3, M5, M9, M13, M17, M21, M25, M29, M33, ED  []  [x]  HIV-risk and PrEP use Anxiety and Stigma Questionnaire prior to the study drug administration     Visits: M17, M29, M33, ED  []  [x]  Sexual Health Practices Questionnaire-administer prior to study drug administration Visits: M1, M3, M5, M9, M13, M17, M21, M25, M29, M33, ED  Yes No Completed:  [x]  []  Review Changes in Medical History    All Visits  [x]  []  Review Changes in Concomitant Medications    All Visits  [x]  []  Assess for AEs, SAEs: All AEs (from Day 1 onwards) and SAEs (from Screening onwards) must be recorded in eCRF at all visits and reported within required timelines.    All Visits  [x]  []  Pull Review in: Medication Review Medication Indication Dose/Route/Frequency Start Date End Date  cetirizine  (Zyrtec )    Seasonal Allergies 10 mg tab PO QD 05Aug2023   Tylenol  Right ventrogluteal tenderness 1000 mg PO once daily 20MAR2025 22MAR2025               AE Review Diagnosis/Symptoms Indicate: NSAE SAE AESI Maximum Grade Related/Not Related to study intervention CAB LA or CAB ULA Action/Dose Change Start Date (Time, if available) End Date (Time, if available)  Right ventrogluteal injection site tenderness NSAE 1 Related CAB LA none 20MAR2025 at 1000 22MAR2025 at 1200  Headache NSAE 1 Related CAB LA none 20MAR2025 at 1000 22MAR2025 at 1200                   Medical History Review  Diagnosis Grade Start Date End Date  Stomach Ulcers 2 2021 2021  Seasonal allergies 2 2008   Dry Skin 2 05Jul2011                  []  [x]  Contraception Review: Report in EcRF Visits: M1, M2, M3, M4, M5, M6, M7, M8, M9, M11, M13, M17, M21,M25, M29, M31, M33, ED, 4WFU, LTFU Visits Male of childbearing potential: Must agree to use a highly effective method of  contraception consistently and correctly, must also continue to use adequate contraception methods for at least 52 weeks after the last injection. Male condoms must be used in addition to hormonal contraception Sexual Abstinence: considered a highly effective method only if defined as refraining from heterosexual intercourse during the entire period of risk associated with the study intervention.  The reliability of sexual abstinence needs to be evaluated in relation to the duration of the study and the preferred and usual lifestyle of the participant.   Yes No/NA Completed:  []  [x]  Premature cardiovascular disease: male participant <65 years or male participant <55 years in first degree relatives only.  Note changes since the start of the study.     Months 5, 13, 21, 29      [] No Changes    []  [x]  Substance Usage: (list name of substance, amount used, start and stop dates)  Months 5, 79, 36, 66 Social History   Substance and Sexual Activity  Alcohol Use Not Currently   Comment: Started in 2017,    Social History   Substance and Sexual Activity  Drug Use No   Comment: Prior THC, Started 2014, stopped 01Feb2025    Tobacco Use: Low Risk  (07/26/2023)   Patient History    Smoking Tobacco Use: Never    Smokeless Tobacco Use: Never    Passive Exposure: Never    Caffeine Use:  caffeine containing beverages/day  []  [x]  Triplicate 12-lead ECG     Visit: M1, M3, M5, M9, M29 Perform in a semi-supine position after 5 minutes of rest.  3 individual ECG tracing should be obtained as closely as possible in succession, but no more than 2 minutes apart. The full set of triplicates should be completed in less than 6 minutes. If ECG abnormal clinically significant  then report as an  AE/SAE. Rest Start Time:  ECG #1 start time:     ECG #2:     ECG #3:    []  [x]  Vital Signs:  [] Weight, BMI (after 5 mins in a semi-supine position)    Only on Visits: M1, M3, M5, M9, M13, M17, M21, M25, M29, M33, ED, 4W  FU, LTFU Visits  [] Height    Visit: M9  [] BP and Pulse (measured in a semi supine position after 5 minutes rest)     Visit: M1, M3, M5, M9, M13, M17, M21, M25, M29, M33, ED, 4WFU  [] Temperature and RR:    Visit: M13, ED  Rest Start Time:   Vital Signs Start Time:     There is no height or weight on file to calculate BMI.  There were no vitals filed for this visit.   []  [x]  Brief Symptom Directed Physical Examination: Will include at a minimum, assessments of skin, lungs, CV, abdomen (liver and spleen). Any abnormalities must be noted in the CRF (e.g current medical conditions or AE logs).     Visit: M1, M3, M5, M6, M7, M8, M9, M11, M13, M17, M21, M25, M29, M33, ED, 4W FU, LTFU   Physical Exam    Yes No Blood Draw Tracking:  All Visits  [x]  []  Verified previous Hgb to be >= 10 (if value <10, adjust volume per unit SOP)                          [] NA, healthy volunteer, previous Hgb results are unavailable  [x]  [x]  Does participant report non-study blood collection in the previous 2 months?  [] Yes, estimated vol. mL    [x]  No  []  [x]  Assess Fasting Status.   Visits: M13, M33                                                      Is participant Fasting for at least 6 hours, overnight fast preferred?  []  No   []  Yes, Date/Time of last meal?   [x]  []  Blood Drawn.     Problems?  [x]  No   [] Yes, why?   Time: 1327    Total Vol: 2 mL   Yes No Lab Collection:  []  [x]  RAPID HIV TESTING        Visit: M1, M3, M5, M9, M13, M17, M21, M25, M29, M33, ED, 4WFU  [] REACTIVE or [] NON REACTIVE  If positive/reactive participant must be withdrawn from the study even if subsequent confirmatory testing is negative. If reactive participant referred for further testing and clinical management as per local standard of care  Is participant experiencing any signs or symptoms consistent with acute HIV infection  [] Yes [x] No   []  [x]  Chemistry     Visits: M1, M3, M4, M5, M6, M7,M8, M9, M11, M13, M17, M21, M25,  M29, M33, ED, 4WFU, LTFU  []  [x]  Hematology     Visits: M1, M3, M4, M5, M6, M7, M8, M9, M11, M13, M17, M21, M25, M29, M33, ED, 4WFU, LTFU  []  [x]  Non rapid HIV immunoassay    Visits: M1, M3, M5, M9, M13, M17, M21, M25, M29, M33, ED, 4WFU LTFU  []  [x]  HIV-1 RNA     Visits: M1, M3, M5, M9, M13, M17, M21, M25, M29, M33, ED, 4WFU, LTFU  []  [x]  Neisseria  gonorrhea (GC), Chlamydia trachomatis (CT), Trichomononas vaginalis (TV), syphilis.  If positive we will refer for treatment as per local guidelines.   Visit: M1, M3, M5, M9, M13, M17, M21, M25, M29, M33, ED, 4WFU, LTFU  PRN if symptomatic at other visits  Is participant experiencing any symptomatic STIs currently? [] Yes [x] No  [] GC/CT NAAT urine/vaginal swab  *Rectal and oropharyngeal swab can be collected based on reported sexual history. [] GC and CT: Rectal Swab [] Oropharyngeal Swab [] TV: vaginal Swab  *If male and menstruating may take sample at next visit  []  [x]  Coagulation Tests     Visit: M13, M33  []  [x]  Hepatitis B&C     Visit: M13, M25, ED  []  [x]  Fasting Labs: Glucose     Visit: M13 and M33  (Insulin, HbA1c only collect if withdrawal visit occurs at M13 or M33)  []  [x]  Plasma for Storage     Visits: M1, M2, M3,M4, M5, M6, M7,M8, M9, M11, M13, M17, M21, M25, M29, ED, LTFU Visits  []  [x]  Predose: PK Sampling  Collect < 1 hr before injection Visits: M1, M3, M5, M9, M13, M17, M21, M25, M29  []  [x]  Illicit Drug Screen     Visits: M5, M13, M21, M29, M33  []  [x]  Urinalysis: Morning specimen is preferred     Visits: M13, M33, ED, 4WFU  Time of Collection:   LMP: No LMP for male patient.  []  N/A   Yes No/NA Lab Collection, Cont.:  []  [x]  Urine Pregnancy Test (POCBP only)  Visits: M1, M3, M5, M9, M13, M17, M21, M25, M29, M33 (resulted prior to receipt of IP), ED, 4WFU, LTFU   Time of Collection:     [] NA, assigned Male at birth or Not a POCBP  []                          [x]  Serum Pregnancy Test (POCBP only) only if urine test  positive, perform a serum test to confirm.   Yes No/NA Study Drug Administration  []  [x]  CAB LA Administration:     Visit: M1        Needle Size: [] 1.5"  [] 2" (For BMI >/= 30)   There is no height or weight on file to calculate BMI. Gauge: [] 21G [] 22G [] 23G  Site: [] Left Ventrogluteal  [] Right Ventrogluteal  Time of Administration:    []  [x]  CAB ULA Administration:   Visit: M3, M5, M9, M13, M17, M21, M25, M29  Sites should be rotated between right and left Needle Size: [] 1.5"  [] 2" (For BMI >/= 30)   There is no height or weight on file to calculate BMI.  Gauge: [] 21G [] 22G [] 23G  Site: [] Left Ventrogluteal  [] Right Ventrogluteal  Time of Administration:    [x]  []  ISR Review completed, ISR photography necessary [] Yes[x] No     All Visits *Digital photographs will be documented at all visits (scheduled or unscheduled) on all participants who have an injection site reaction that is a visible, persistent Grade 2 (no improvement/resolution in >10 days), Grade >/=3, or serious. *Examination will include a physician/HCP assessment of pain (or tenderness), pruritus, warmth, infections, rash, erythema (or redness), swelling (or induration), and nodules (granulomas or cysts)  *Remote Visits: M6+2W, M7+2W, M8+2W, M10, M12, M15, M19, M23, M27, M31 should assess whether participants have any new ISR symptoms/signs since their last visit or have previously reported ISR that has not improved or is worsening, if present site should perform an unscheduled visits ASAP for ISR assessment  Yes No/NA  Pharmacokinetic Sampling  [x]  []   Post Dose PK sampling: 2 hours after injection on dosing days(+/- 30 mins collection window Visits:1 week, M1, M1+1Wk, M2, M3, M3+1Wk, M4, M5, M5+1W, M6, M7, M8, M9, , M9+1Wk, M11, M13, M33, ED, 4WFU, LTFU     Time of PK Collection: 1327    Yes No Completed:  [x]  []   HIV/STI  Counseling/Condom Distribution:    All Visits, except remote calls [] Accepted  [x] Declined    Yes No/NA Completed  [x]  []  Was there an ISR during remote phone call visit:  [] Yes [x]  No If yes, schedule unscheduled ISR assessment ASAP Date:   [x]  []  Instructed to contact site if ISR gets worse or does not improve after 10 days from the last ISR assessment.  [x]  []  Assess for Protocol Deviations  [x]  []  Reminders:  Participants of childbearing potential agree to use a highly effective method of contraception consistently and correctly HIV vaccines are not permitted at any time during the study No other experimental agents, antiretroviral drugs, cytotoxic chemotherapy, or radiation therapy my not be administered throughout the trial No systemically administered immunomodulator's permitted Notify study staff if you experience any signs/symptoms, visit provider in ER or scheduled evaluation, or start any new OTC, supplemental or prescription therapies   Yes No Completed:  [x]  []  Compensation:  You will receive a payment of $78.00 at the end of each non dosing visit completed to reimburse you for your time and effort.  You will receive $125.00 at the end of each dosing visit completed to reimburse you for your time and effort.   If you must return to the clinic for an unscheduled visit, you will receive $78.   Amount: $78.00   [x]  []  Schedule Next Visit: Visits should be planned using a calendar months and scheduled based on the date of the Day 1 injection.    Remote Call Visits schedule on M6+2 wk, M7+2wk, M8+2wk, M10, M12, M15, M19, M23, M27, M31 (Assess ISR, AES, Conmeds)  If transitioning to CAB 200mg /ml do not need LTFU visits, but do need ED visit and 4 week followup visit.  Date: 23APR2025     Time: 0830   Version 2.0 06Mar2025 JLS

## 2023-08-31 ENCOUNTER — Other Ambulatory Visit: Payer: Self-pay

## 2023-08-31 ENCOUNTER — Encounter

## 2023-08-31 DIAGNOSIS — Z006 Encounter for examination for normal comparison and control in clinical research program: Secondary | ICD-10-CM

## 2023-08-31 MED ORDER — STUDY - EXTEND - CABOTEGRAVIR LA (GSK1265744) 600MG/3ML IM INJECTION (PI-VAN DAM)
600.0000 mg | INJECTION | Freq: Once | INTRAMUSCULAR | Status: AC
  Administered 2023-08-31: 600 mg via INTRAMUSCULAR
  Filled 2023-08-31: qty 3

## 2023-08-31 NOTE — Research (Cosign Needed)
 Johnny Nelson [id number]                                                  Protocol Amendment 02  [facility]:  [phone]                                                                  TREATMENT PHASE  Visit: []  Day1+1W     [x]  M1    []  M1+1W    []  M2    []  M3    []  M3+1W    []  M4    [] M5     [] M5+1W   [] M6   [] M6+2W   [] M7   [] M7+2W   [] M8   [] M8+2W   [] M9 [] M9+1W   [] M10   [] M11   [] M12   [] M13   [] M15    [] M17   [] M19   [] M21   [] M23   [] M25   [] M27   [] M29   [] M31   [] M33   [] ED  [] 4 wk followup (if not entering LTFU)   [] LTFU M1   [] LTFU M4   [] LTFU M8   [] LTFU M12     Date: 23APR2025         4 Letter Code: MCEA Subject ID: [id number]  Assessments should occur in the following order:  eCSSRs  All other questionnaires  Triplicate 12-lead ECG  Vital signs  Blood draws (clinical laboratory tests, pre-dose pharmacokinetics, etc)  IP injection  Post-dose pharmacokinetics (if required)  Yes No Completed:  [x]  []  Verify participant identity before doing any study procedures   All Visits  [x]  []  Verify correct version of ICF is signed   All Visits   Yes No Questionnaires Completed: Patient Reported Outcome Assessment must be completed before other assessments take place at each designated visit. Completed electronically by participants.  [x]  []  eCSSRs    Visits: M1, M3, M5, M9, M13, M17, M21, M25, M29, M33, ED Any positive  (abnormal) response indicating SIB or any unusual changes in behavior, confirmed by the investigator will result in their discontinuation of study intervention, the PI/SI will arrange for urgent specialist psychiatric evaluation and management  []  [x]  Social Determinants of Health Questionnaire (SDoH)     Visits: M17, ED  [x]  []  Study Medication Satisfaction Questionnaire (SMSQs)     Visits: M1, M3, M5, M9, M13, M17, M21, M25, M29, M33, ED  []  [x]  PrEP Preference and Rationale Questionnaire (only if prior exposure to oral PrEP)     Visits: M9, M17, M25, M33, ED  [x]  []   Acceptability of Injection Questionnaire     Visits: M1, M3, M5, M9, M13, M17, M21, M25, M29, M33, ED  [x]  []  Generalized Anxiety Disorders (GAD-&) Questionnaire     Visits: M1, M3, M5, M9, M13, M17, M21, M25, M29, M33, ED  []  [x]  HIV-risk and PrEP use Anxiety and Stigma Questionnaire prior to the study drug administration     Visits: M17, M29, M33, ED  [x]  []  Sexual Health Practices Questionnaire-administer prior to study drug administration Visits: M1, M3, M5, M9, M13, M17, M21, M25, M29, M33, ED  Yes No Completed:  [x]  []  Review Changes in Medical History    All Visits  [x]  []  Review Changes in Concomitant Medications    All Visits  [x]  []  Assess for AEs, SAEs: All AEs (from Day 1 onwards) and SAEs (from Screening onwards) must be recorded in eCRF at all visits and reported within required timelines.    All Visits  [x]  []  Pull Review in: Medication Review Medication Review Medication Indication Dose/Route/Frequency Start Date End Date  cetirizine  (Zyrtec )    Seasonal Allergies 10 mg tab PO QD 05Aug2023   Tylenol  Right ventrogluteal tenderness 1000 mg PO once daily 20MAR2025 22MAR2025               AE Review Diagnosis/Symptoms Indicate: NSAE SAE AESI Maximum Grade Related/Not Related to study intervention CAB LA or CAB ULA Action/Dose Change Start Date (Time, if available) End Date (Time, if available)  Right ventrogluteal injection site tenderness NSAE AESI 2 Related CAB LA none 20MAR2025 at 1000 22MAR2025 at 1200  Headache NSAE 1 Related CAB LA none 20MAR2025 at 1000 22MAR2025 at 1200  Right ventrogluteal injection site tenderness NSAE 2 Related to CAB LA none 20MAR2025 22MAR2025           Medical History Review  Diagnosis Grade Start Date End Date  Stomach Ulcers 2 2021 2021  Seasonal allergies 2 2008   Dry Skin 2 05Jul2011                     [x]  []  Contraception Review: Report in [facility] Visits: M1, M2, M3, M4, M5, M6, M7, M8, M9, M11, M13, M17, M21,M25, M29, M31,  M33, ED, 4WFU, LTFU Visits Male of childbearing potential: Must agree to use a highly effective method of contraception consistently and correctly, must also continue to use adequate contraception methods for at least 52 weeks after the last injection. Male condoms must be used in addition to hormonal contraception Sexual Abstinence: considered a highly effective method only if defined as refraining from heterosexual intercourse during the entire period of risk associated with the study intervention.  The reliability of sexual abstinence needs to be evaluated in relation to the duration of the study and the preferred and usual lifestyle of the participant.   Yes No/NA Completed:  []  [x]  Premature cardiovascular disease: male participant <65 years or male participant <55 years in first degree relatives only.  Note changes since the start of the study.     Months 5, 13, 21, 29      [] No Changes    []  [x]  Substance Usage: (list name of substance, amount used, start and stop dates)  Months 5, 62, 2, 26 Social History   Substance and Sexual Activity  Alcohol Use Not Currently   Comment: Started in 2017,    Social History   Substance and Sexual Activity  Drug Use No   Comment: Prior THC, Started 2014, stopped 01Feb2025    Tobacco Use: Low Risk  (09/01/2023)   Patient History    Smoking Tobacco Use: Never    Smokeless Tobacco Use: Never    Passive Exposure: Never    Caffeine Use:  caffeine containing beverages/day  [x]  []  Triplicate 12-lead ECG     Visit: M1, M3, M5, M9, M29 Perform in a semi-supine position after 5 minutes of rest.  3 individual ECG tracing should be obtained as closely as possible in succession, but no more than 2 minutes apart. The full set of triplicates should be completed in less  than 6 minutes. If ECG abnormal clinically significant  then report as an AE/SAE.  Rest Start Time: 1032 ECG #1 start time: 1043    ECG #2: 1044    ECG #3: 1045   [x]  []  Vital Signs:   [x] Weight, BMI (after 5 mins in a semi-supine position)    Only on Visits: M1, M3, M5, M9, M13, M17, M21, M25, M29, M33, ED, 4W FU, LTFU Visits  [] Height    Visit: M9  [x] BP and Pulse (measured in a semi supine position after 5 minutes rest)     Visit: M1, M3, M5, M9, M13, M17, M21, M25, M29, M33, ED, 4WFU  [] Temperature and RR:    Visit: M13, ED  Rest Start Time: 1045  Vital Signs Start Time: 1050  Last Weight  Most recent update: 08/31/2023 10:22 AM    Weight  68.2 kg (150 lb 5.7 oz)             Body mass index is 18.79 kg/m.  Vitals:   08/31/23 1050  BP: 121/79  Pulse: 76     [x]  []  Brief Symptom Directed Physical Examination: Will include at a minimum, assessments of skin, lungs, CV, abdomen (liver and spleen). Any abnormalities must be noted in the CRF (e.g current medical conditions or AE logs).     Visit: M1, M3, M5, M6, M7, M8, M9, M11, M13, M17, M21, M25, M29, M33, ED, 4W FU, LTFU   Physical Exam Constitutional:      Appearance: Normal appearance.  Cardiovascular:     Rate and Rhythm: Normal rate and regular rhythm.  Pulmonary:     Effort: Pulmonary effort is normal. No respiratory distress.     Breath sounds: Normal breath sounds.  Abdominal:     General: Abdomen is flat. Bowel sounds are normal.  Skin:    General: Skin is warm and dry.  Neurological:     Mental Status: He is alert and oriented to person, place, and time.       Yes No Blood Draw Tracking:  All Visits  [x]  []  Verified previous Hgb to be >= 10 (if value <10, adjust volume per unit SOP)                          [] NA, healthy volunteer, previous Hgb results are unavailable  [x]  []  Does participant report non-study blood collection in the previous 2 months?  [] Yes, estimated vol. mL    [x]  No  [x]  []  Assess Fasting Status.   Visits: M13, M33                                                      Is participant Fasting for at least 6 hours, overnight fast preferred?  [x]  No   []  Yes, Date/Time of  last meal?   [x]  []  Blood Drawn.     Problems?  []  No   [x] Yes, why? Stuck two times  Time: 1103    Total Vol: 38 mL   Yes No Lab Collection:  [x]  []  RAPID HIV TESTING        Visit: M1, M3, M5, M9, M13, M17, M21, M25, M29, M33, ED, 4WFU  [] REACTIVE or [x] NON REACTIVE  If positive/reactive participant must be withdrawn from the study even if subsequent confirmatory testing is  negative. If reactive participant referred for further testing and clinical management as per local standard of care  Is participant experiencing any signs or symptoms consistent with acute HIV infection  [] Yes [x] No   [x]  []  Chemistry     Visits: M1, M3, M4, M5, M6, M7,M8, M9, M11, M13, M17, M21, M25, M29, M33, ED, 4WFU, LTFU  [x]  []  Hematology     Visits: M1, M3, M4, M5, M6, M7, M8, M9, M11, M13, M17, M21, M25, M29, M33, ED, 4WFU, LTFU  [x]  []  Non rapid HIV immunoassay    Visits: M1, M3, M5, M9, M13, M17, M21, M25, M29, M33, ED, 4WFU LTFU  [x]  []  HIV-1 RNA     Visits: M1, M3, M5, M9, M13, M17, M21, M25, M29, M33, ED, 4WFU, LTFU  [x]  []  Neisseria gonorrhea (GC), Chlamydia trachomatis (CT), Trichomononas vaginalis (TV), syphilis.  If positive we will refer for treatment as per local guidelines.   Visit: M1, M3, M5, M9, M13, M17, M21, M25, M29, M33, ED, 4WFU, LTFU  PRN if symptomatic at other visits  Is participant experiencing any symptomatic STIs currently? [] Yes [x] No  [x] GC/CT NAAT urine/vaginal swab  *Rectal and oropharyngeal swab can be collected based on reported sexual history. [x] GC and CT: Rectal Swab [x] Oropharyngeal Swab [] TV: vaginal Swab  *If male and menstruating may take sample at next visit  []  [x]  Coagulation Tests     Visit: M13, M33  []  [x]  Hepatitis B&C     Visit: M13, M25, ED  []  [x]  Fasting Labs: Glucose     Visit: M13 and M33  (Insulin, HbA1c only collect if withdrawal visit occurs at M13 or M33)  [x]  []  Plasma for Storage     Visits: M1, M2, M3,M4, M5, M6, M7,M8, M9, M11, M13, M17,  M21, M25, M29, ED, LTFU Visits  [x]  []  Predose: PK Sampling  Collect < 1 hr before injection Visits: M1, M3, M5, M9, M13, M17, M21, M25, M29  []  [x]  Illicit Drug Screen     Visits: M5, M13, M21, M29, M33  []  [x]  Urinalysis: Morning specimen is preferred     Visits: M13, M33, ED, 4WFU  Time of Collection:   LMP: No LMP for male patient.  [x]  N/A   Yes No/NA Lab Collection, Cont.:  []  [x]  Urine Pregnancy Test (POCBP only)  Visits: M1, M3, M5, M9, M13, M17, M21, M25, M29, M33 (resulted prior to receipt of IP), ED, 4WFU, LTFU   Time of Collection:     [x] NA, assigned Male at birth or Not a POCBP  []                          [x]  Serum Pregnancy Test (POCBP only) only if urine test positive, perform a serum test to confirm.   Yes No/NA Study Drug Administration  [x]  []  CAB LA Administration:     Visit: M1        Needle Size: [x] 1.5"  [] 2" (For BMI >/= 30)   Body mass index is 18.79 kg/m. Gauge: [] 21G [x] 22G [] 23G  Site: [x] Left Ventrogluteal  [] Right Ventrogluteal  Time of Administration: 1124   []  [x]  CAB ULA Administration:   Visit: M3, M5, M9, M13, M17, M21, M25, M29  Sites should be rotated between right and left Needle Size: [] 1.5"  [] 2" (For BMI >/= 30)   Body mass index is 18.79 kg/m.  Gauge: [] 21G [] 22G [] 23G  Site: [] Left Ventrogluteal  [] Right Ventrogluteal  Time of Administration:    [  x] []  ISR Review completed, ISR photography necessary [] Yes[x] No     All Visits *Digital photographs will be documented at all visits (scheduled or unscheduled) on all participants who have an injection site reaction that is a visible, persistent Grade 2 (no improvement/resolution in >10 days), Grade >/=3, or serious. *Examination will include a physician/HCP assessment of pain (or tenderness), pruritus, warmth, infections, rash, erythema (or redness), swelling (or induration), and nodules (granulomas or cysts)  *Remote Visits: M6+2W, M7+2W, M8+2W, M10, M12, M15, M19, M23, M27, M31 should assess  whether participants have any new ISR symptoms/signs since their last visit or have previously reported ISR that has not improved or is worsening, if present site should perform an unscheduled visits ASAP for ISR assessment   Yes No/NA  Pharmacokinetic Sampling  [x]  []   Post Dose PK sampling: 2 hours after injection on dosing days(+/- 30 mins collection window Visits:1 week, M1, M1+1Wk, M2, M3, M3+1Wk, M4, M5, M5+1W, M6, M7, M8, M9, , M9+1Wk, M11, M13, M33, ED, 4WFU, LTFU   Time of Injection: 1124  Time of PK Collection: 1306    Yes No Completed:  [x]  []   HIV/STI  Counseling/Condom Distribution:    All Visits, except remote calls [x] Accepted  [] Declined   Yes No/NA Completed  []  [x]  Was there an ISR during remote phone call visit:  [] Yes [x]  No If yes, schedule unscheduled ISR assessment ASAP Date:   [x]  []  Instructed to contact site if ISR gets worse or does not improve after 10 days from the last ISR assessment.  [x]  []  Assess for Protocol Deviations  [x]  []  Reminders:  Participants of childbearing potential agree to use a highly effective method of contraception consistently and correctly HIV vaccines are not permitted at any time during the study No other experimental agents, antiretroviral drugs, cytotoxic chemotherapy, or radiation therapy my not be administered throughout the trial No systemically administered immunomodulator's permitted Notify study staff if you experience any signs/symptoms, visit provider in ER or scheduled evaluation, or start any new OTC, supplemental or prescription therapies   Yes No Completed:  [x]  []  Compensation:  You will receive a payment of $78.00 at the end of each non dosing visit completed to reimburse you for your time and effort.  You will receive $125.00 at the end of each dosing visit completed to reimburse you for your time and effort.   If you must return to the clinic for an unscheduled visit, you will receive $78.   Amount: $125.00    [x]  []  Schedule Next Visit: Visits should be planned using a calendar months and scheduled based on the date of the Day 1 injection.    Remote Call Visits schedule on M6+2 wk, M7+2wk, M8+2wk, M10, M12, M15, M19, M23, M27, M31 (Assess ISR, AES, Conmeds)  If transitioning to CAB 200mg /ml do not need LTFU visits, but do need ED visit and 4 week followup visit.  Date: 30APR2025     Time: 1330   Version 2.0 06Mar2025 JLS

## 2023-09-07 ENCOUNTER — Encounter

## 2023-09-07 ENCOUNTER — Other Ambulatory Visit: Payer: Self-pay

## 2023-09-07 DIAGNOSIS — Z006 Encounter for examination for normal comparison and control in clinical research program: Secondary | ICD-10-CM

## 2023-09-07 NOTE — Progress Notes (Signed)
 I have reviewed the chart and associated labs.

## 2023-09-07 NOTE — Research (Addendum)
 Johnny Nelson [id number]                                                  Protocol Amendment 02  [facility]:  [phone]                                                                  TREATMENT PHASE  Visit: []  Day1+1W     []  M1    [x]  M1+1W    []  M2    []  M3    []  M3+1W    []  M4    [] M5     [] M5+1W   [] M6   [] M6+2W   [] M7   [] M7+2W   [] M8   [] M8+2W   [] M9 [] M9+1W   [] M10   [] M11   [] M12   [] M13   [] M15    [] M17   [] M19   [] M21   [] M23   [] M25   [] M27   [] M29   [] M31   [] M33   [] ED  [] 4 wk followup (if not entering LTFU)   [] LTFU M1   [] LTFU M4   [] LTFU M8   [] LTFU M12     Date: 09/07/23         4 Letter Code: MCEA  Subject ID: [id number]  Assessments should occur in the following order:  eCSSRs  All other questionnaires  Triplicate 12-lead ECG  Vital signs  Blood draws (clinical laboratory tests, pre-dose pharmacokinetics, etc)  IP injection  Post-dose pharmacokinetics (if required)  Yes No Completed:  [x]  []  Verify participant identity before doing any study procedures   All Visits  [x]  []  Verify correct version of ICF is signed   All Visits   Yes No Questionnaires Completed: Patient Reported Outcome Assessment must be completed before other assessments take place at each designated visit. Completed electronically by participants.  []  [x]  eCSSRs    Visits: M1, M3, M5, M9, M13, M17, M21, M25, M29, M33, ED Any positive  (abnormal) response indicating SIB or any unusual changes in behavior, confirmed by the investigator will result in their discontinuation of study intervention, the PI/SI will arrange for urgent specialist psychiatric evaluation and management  []  [x]  Social Determinants of Health Questionnaire (SDoH)     Visits: M17, ED  []  [x]  Study Medication Satisfaction Questionnaire (SMSQs)     Visits: M1, M3, M5, M9, M13, M17, M21, M25, M29, M33, ED  []  [x]  PrEP Preference and Rationale Questionnaire (only if prior exposure to oral PrEP)     Visits: M9, M17, M25, M33, ED  []  [x]   Acceptability of Injection Questionnaire     Visits: M1, M3, M5, M9, M13, M17, M21, M25, M29, M33, ED  []  [x]  Generalized Anxiety Disorders (GAD-&) Questionnaire     Visits: M1, M3, M5, M9, M13, M17, M21, M25, M29, M33, ED  []  [x]  HIV-risk and PrEP use Anxiety and Stigma Questionnaire prior to the study drug administration     Visits: M17, M29, M33, ED  []  [x]  Sexual Health Practices Questionnaire-administer prior to study drug administration Visits: M1, M3, M5, M9, M13, M17, M21, M25, M29, M33, ED  Yes No Completed:  [x]  []  Review Changes in Medical History    All Visits  [x]  []  Review Changes in Concomitant Medications    All Visits  [x]  []  Assess for AEs, SAEs: All AEs (from Day 1 onwards) and SAEs (from Screening onwards) must be recorded in eCRF at all visits and reported within required timelines.    All Visits  [x]  []  Pull Review in:  Medication Review Medication Indication Dose/Route/Frequency Start Date End Date  cetirizine  (Zyrtec )    Seasonal Allergies 10 mg tab PO QD 05Aug2023   Tylenol  Right ventrogluteal Injection Site tenderness 1000 mg PO once daily 20MAR2025 22MAR2025  Tylenol  Left Ventrogluteal Injection Site Tenderness 1000mg  PO once daily 26APR2025 26APR2025         AE Review Diagnosis/Symptoms Indicate: NSAE SAE AESI Maximum Grade Related/Not Related to study intervention CAB LA or CAB ULA Action/Dose Change Start Date (Time, if available) End Date (Time, if available)  Right ventrogluteal injection site tenderness NSAE AESI 2 Related CAB LA none 20MAR2025 at 1000 22MAR2025 at 1200  Headache NSAE 1 Related CAB LA none 20MAR2025 at 1000 22MAR2025 at 1200  Left ventrogluteal injection site tenderness NSAE AESI 2 Related to CAB LA none 23APR2025 at 2200 27APR2025 at 0900  Nausea NSAE 1 Related to CAB LA none 26APR2025 at 0900  27APR2025 at 0900           Medical History Review  Diagnosis Grade Start Date End Date  Stomach Ulcers 2 2021 2021  Seasonal allergies  2 2008   Dry Skin 2 05Jul2011                 []  [x]  Contraception Review: Report in [facility] Visits: M1, M2, M3, M4, M5, M6, M7, M8, M9, M11, M13, M17, M21,M25, M29, M31, M33, ED, 4WFU, LTFU Visits Male of childbearing potential: Must agree to use a highly effective method of contraception consistently and correctly, must also continue to use adequate contraception methods for at least 52 weeks after the last injection. Male condoms must be used in addition to hormonal contraception Sexual Abstinence: considered a highly effective method only if defined as refraining from heterosexual intercourse during the entire period of risk associated with the study intervention.  The reliability of sexual abstinence needs to be evaluated in relation to the duration of the study and the preferred and usual lifestyle of the participant.   Yes No/NA Completed:  []  [x]  Premature cardiovascular disease: male participant <65 years or male participant <55 years in first degree relatives only.  Note changes since the start of the study.     Months 5, 13, 21, 29     [] No Changes    []  [x]  Substance Usage: (list name of substance, amount used, start and stop dates)  Months 5, 64, 21, 48 Social History   Substance and Sexual Activity  Alcohol Use Not Currently   Comment: Started in 2017,    Social History   Substance and Sexual Activity  Drug Use No   Comment: Prior THC, Started 2014, stopped 01Feb2025    Tobacco Use: Low Risk  (09/01/2023)   Patient History    Smoking Tobacco Use: Never    Smokeless Tobacco Use: Never    Passive Exposure: Never    Caffeine Use: caffeine containing beverages/day  []  [x]  Triplicate 12-lead ECG     Visit: M1, M3, M5, M9, M29 Perform in a semi-supine position after 5 minutes of rest.  3 individual ECG tracing should be obtained  as closely as possible in succession, but no more than 2 minutes apart. The full set of triplicates should be completed in less than 6 minutes.  If ECG abnormal clinically significant  then report as an AE/SAE.  Rest Start Time:  ECG #1 start time:     ECG #2:     ECG #3:    []  [x]  Vital Signs:  [] Weight, BMI (after 5 mins in a semi-supine position)    Only on Visits: M1, M3, M5, M9, M13, M17, M21, M25, M29, M33, ED, 4W FU, LTFU Visits  [] Height    Visit: M9  [] BP and Pulse (measured in a semi supine position after 5 minutes rest)     Visit: M1, M3, M5, M9, M13, M17, M21, M25, M29, M33, ED, 4WFU  [] Temperature and RR:    Visit: M13, ED  Rest Start Time:  Vital Signs Start Time:     There is no height or weight on file to calculate BMI.  There were no vitals filed for this visit.   []  [x]  Brief Symptom Directed Physical Examination: Will include at a minimum, assessments of skin, lungs, CV, abdomen (liver and spleen). Any abnormalities must be noted in the CRF (e.g current medical conditions or AE logs).     Visit: M1, M3, M5, M6, M7, M8, M9, M11, M13, M17, M21, M25, M29, M33, ED, 4W FU, LTFU       Yes No Blood Draw Tracking:  All Visits  [x]  []  Verified previous Hgb to be >= 10 (if value <10, adjust volume per unit SOP)                          [] NA, healthy volunteer, previous Hgb results are unavailable  [x]  []  Does participant report non-study blood collection in the previous 2 months?  [] Yes, estimated vol. mL    [x]  No  []  [x]  Assess Fasting Status.   Visits: M13, M33                                                      Is participant Fasting for at least 6 hours, overnight fast preferred?  []  No   []  Yes, Date/Time of last meal?   [x]  []  Blood Drawn.     Problems?  [x]  No   [] Yes, why?   Time: 1352    Total Vol: 2 mL   Yes No Lab Collection:  []  [x]  RAPID HIV TESTING        Visit: M1, M3, M5, M9, M13, M17, M21, M25, M29, M33, ED, 4WFU  [] REACTIVE or [] NON REACTIVE  If positive/reactive participant must be withdrawn from the study even if subsequent confirmatory testing is negative. If reactive participant  referred for further testing and clinical management as per local standard of care  Is participant experiencing any signs or symptoms consistent with acute HIV infection  [] Yes [] No   []  [x]  Chemistry     Visits: M1, M3, M4, M5, M6, M7,M8, M9, M11, M13, M17, M21, M25, M29, M33, ED, 4WFU, LTFU  []  [x]  Hematology     Visits: M1, M3, M4, M5, M6, M7, M8, M9, M11, M13, M17, M21, M25, M29, M33, ED, 4WFU, LTFU  []  [x]  Non rapid HIV immunoassay    Visits: M1, M3, M5, M9, M13,  M17, M21, M25, M29, M33, ED, 4WFU LTFU  []  [x]  HIV-1 RNA     Visits: M1, M3, M5, M9, M13, M17, M21, M25, M29, M33, ED, 4WFU, LTFU  []  [x]  Neisseria gonorrhea (GC), Chlamydia trachomatis (CT), Trichomononas vaginalis (TV), syphilis.  If positive we will refer for treatment as per local guidelines.   Visit: M1, M3, M5, M9, M13, M17, M21, M25, M29, M33, ED, 4WFU, LTFU  PRN if symptomatic at other visits  Is participant experiencing any symptomatic STIs currently? [] Yes [] No  [] GC/CT NAAT urine/vaginal swab  *Rectal and oropharyngeal swab can be collected based on reported sexual history. [] GC and CT: Rectal Swab [] Oropharyngeal Swab [] TV: vaginal Swab  *If male and menstruating may take sample at next visit  []  [x]  Coagulation Tests     Visit: M13, M33  []  [x]  Hepatitis B&C     Visit: M13, M25, ED  []  [x]  Fasting Labs: Glucose     Visit: M13 and M33  (Insulin, HbA1c only collect if withdrawal visit occurs at M13 or M33)  []  [x]  Plasma for Storage     Visits: M1, M2, M3,M4, M5, M6, M7,M8, M9, M11, M13, M17, M21, M25, M29, ED, LTFU Visits  []  [x]  Predose: PK Sampling  Collect < 1 hr before injection Visits: M1, M3, M5, M9, M13, M17, M21, M25, M29  []  [x]  Illicit Drug Screen     Visits: M5, M13, M21, M29, M33  []  [x]  Urinalysis: Morning specimen is preferred     Visits: M13, M33, ED, 4WFU  Time of Collection:  LMP: No LMP for male patient.  []  N/A   Yes No/NA Lab Collection, Cont.:  []  [x]  Urine Pregnancy Test (POCBP  only)  Visits: M1, M3, M5, M9, M13, M17, M21, M25, M29, M33 (resulted prior to receipt of IP), ED, 4WFU, LTFU   Time of Collection:   [] NA, assigned Male at birth or Not a POCBP  []                          [x]  Serum Pregnancy Test (POCBP only) only if urine test positive, perform a serum test to confirm.   Yes No/NA Study Drug Administration  []  [x]  CAB LA Administration:     Visit: M1        Needle Size: [] 1.5"  [] 2" (For BMI >/= 30)   There is no height or weight on file to calculate BMI. Gauge: [] 21G [] 22G [] 23G  Site: [] Left Ventrogluteal  [] Right Ventrogluteal  Time of Administration:    []  [x]  CAB ULA Administration:   Visit: M3, M5, M9, M13, M17, M21, M25, M29  Sites should be rotated between right and left Needle Size: [] 1.5"  [] 2" (For BMI >/= 30)   There is no height or weight on file to calculate BMI.  Gauge: [] 21G [] 22G [] 23G  Site: [] Left Ventrogluteal  [] Right Ventrogluteal  Time of Administration:    [x]  []  ISR Review completed, ISR photography necessary [] Yes[x] No     All Visits *Digital photographs will be documented at all visits (scheduled or unscheduled) on all participants who have an injection site reaction that is a visible, persistent Grade 2 (no improvement/resolution in >10 days), Grade >/=3, or serious. *Examination will include a physician/HCP assessment of pain (or tenderness), pruritus, warmth, infections, rash, erythema (or redness), swelling (or induration), and nodules (granulomas or cysts)  *Remote Visits: M6+2W, M7+2W, M8+2W, M10, M12, M15, M19, M23, M27, M31 should assess whether participants have any  new ISR symptoms/signs since their last visit or have previously reported ISR that has not improved or is worsening, if present site should perform an unscheduled visits ASAP for ISR assessment   Yes No/NA  Pharmacokinetic Sampling  [x]  []   Post Dose PK sampling: 2 hours after injection on dosing days(+/- 30 mins collection window Visits:1 week, M1,  M1+1Wk, M2, M3, M3+1Wk, M4, M5, M5+1W, M6, M7, M8, M9, , M9+1Wk, M11, M13, M33, ED, 4WFU, LTFU   Time of Injection: 23Apr2025 at 1124   Time of PK Collection: 30Apr2025 at 1352    Yes No Completed:  [x]  []   HIV/STI  Counseling/Condom Distribution:    All Visits, except remote calls [x] Accepted  [] Declined   Yes No/NA Completed  []  [x]  Was there an ISR during remote phone call visit:  [] Yes []  No If yes, schedule unscheduled ISR assessment ASAP Date:   [x]  []  Instructed to contact site if ISR gets worse or does not improve after 10 days from the last ISR assessment.  [x]  []  Assess for Protocol Deviations  [x]  []  Reminders:  Participants of childbearing potential agree to use a highly effective method of contraception consistently and correctly HIV vaccines are not permitted at any time during the study No other experimental agents, antiretroviral drugs, cytotoxic chemotherapy, or radiation therapy my not be administered throughout the trial No systemically administered immunomodulator's permitted Notify study staff if you experience any signs/symptoms, visit provider in ER or scheduled evaluation, or start any new OTC, supplemental or prescription therapies   Yes No Completed:  [x]  []  Compensation:  You will receive a payment of $78.00 at the end of each non dosing visit completed to reimburse you for your time and effort.  You will receive $125.00 at the end of each dosing visit completed to reimburse you for your time and effort.   If you must return to the clinic for an unscheduled visit, you will receive $78.   Amount: $78.00   [x]  []  Schedule Next Visit: Visits should be planned using a calendar months and scheduled based on the date of the Day 1 injection.    Remote Call Visits schedule on M6+2 wk, M7+2wk, M8+2wk, M10, M12, M15, M19, M23, M27, M31 (Assess ISR, AES, Conmeds)  If transitioning to CAB 200mg /ml do not need LTFU visits, but do need ED visit and 4 week followup  visit.  Date: 20May2025     Time: 0800   Version 2.0 06Mar2025 JLS

## 2023-09-08 ENCOUNTER — Telehealth: Payer: Self-pay

## 2023-09-08 NOTE — Telephone Encounter (Signed)
 Visit text reminder

## 2023-09-08 NOTE — Progress Notes (Signed)
 Qc'd KP 08 Sep 2023

## 2023-09-13 NOTE — Research (Addendum)
 Johnny Nelson

## 2023-09-23 ENCOUNTER — Telehealth: Payer: Self-pay

## 2023-09-23 NOTE — Telephone Encounter (Signed)
 Appointment reminder text sent

## 2023-09-26 ENCOUNTER — Other Ambulatory Visit: Payer: Self-pay

## 2023-09-26 ENCOUNTER — Encounter

## 2023-09-26 DIAGNOSIS — Z006 Encounter for examination for normal comparison and control in clinical research program: Secondary | ICD-10-CM

## 2023-09-26 NOTE — Research (Signed)
 Johnny Nelson [id number]                                                  Protocol Amendment 02  [facility]:  [phone]                                                                  TREATMENT PHASE  Visit: []  Day1+1W     []  M1    []  M1+1W    [x]  M2    []  M3    []  M3+1W    []  M4    [] M5     [] M5+1W   [] M6   [] M6+2W   [] M7   [] M7+2W   [] M8   [] M8+2W   [] M9 [] M9+1W   [] M10   [] M11   [] M12   [] M13   [] M15    [] M17   [] M19   [] M21   [] M23   [] M25   [] M27   [] M29   [] M31   [] M33   [] ED  [] 4 wk followup (if not entering LTFU)   [] LTFU M1   [] LTFU M4   [] LTFU M8   [] LTFU M12     Date: [id number]         4 Letter Code: MCEA Subject ID: [id number]  Assessments should occur in the following order:  eCSSRs  All other questionnaires  Triplicate 12-lead ECG  Vital signs  Blood draws (clinical laboratory tests, pre-dose pharmacokinetics, etc)  IP injection  Post-dose pharmacokinetics (if required)  Yes No Completed:  [x]  []  Verify participant identity before doing any study procedures   All Visits  [x]  []  Verify correct version of ICF is signed   All Visits   Yes No Questionnaires Completed: Patient Reported Outcome Assessment must be completed before other assessments take place at each designated visit. Completed electronically by participants.  []  [x]  eCSSRs    Visits: M1, M3, M5, M9, M13, M17, M21, M25, M29, M33, ED Any positive  (abnormal) response indicating SIB or any unusual changes in behavior, confirmed by the investigator will result in their discontinuation of study intervention, the PI/SI will arrange for urgent specialist psychiatric evaluation and management  []  [x]  Social Determinants of Health Questionnaire (SDoH)     Visits: M17, ED  []  [x]  Study Medication Satisfaction Questionnaire (SMSQs)     Visits: M1, M3, M5, M9, M13, M17, M21, M25, M29, M33, ED  []  [x]  PrEP Preference and Rationale Questionnaire (only if prior exposure to oral PrEP)     Visits: M9, M17, M25, M33, ED  []  [x]   Acceptability of Injection Questionnaire     Visits: M1, M3, M5, M9, M13, M17, M21, M25, M29, M33, ED  []  [x]  Generalized Anxiety Disorders (GAD-&) Questionnaire     Visits: M1, M3, M5, M9, M13, M17, M21, M25, M29, M33, ED  []  [x]  HIV-risk and PrEP use Anxiety and Stigma Questionnaire prior to the study drug administration     Visits: M17, M29, M33, ED  []  [x]  Sexual Health Practices Questionnaire-administer prior to study drug administration Visits: M1, M3, M5, M9, M13, M17, M21, M25, M29, M33, ED  Yes No Completed:  [x]  []  Review Changes in Medical History    All Visits  [x]  []  Review Changes in Concomitant Medications    All Visits  [x]  []  Assess for AEs, SAEs: All AEs (from Day 1 onwards) and SAEs (from Screening onwards) must be recorded in eCRF at all visits and reported within required timelines.    All Visits  Is participant experiencing any symptomatic STIs currently? [] Yes [x] No    [x]  []  Pull Review in: Medication Review Medication Indication Dose/Route/Frequency Start Date End Date  cetirizine  (Zyrtec )    Seasonal Allergies 10 mg tab PO QD 05Aug2023   Tylenol  Right ventrogluteal Injection Site tenderness 1000 mg PO once daily 20MAR2025 22MAR2025  Tylenol  Left Ventrogluteal Injection Site Tenderness 1000mg  PO once daily 26APR2025 26APR2025         AE Review Diagnosis/Symptoms Indicate: NSAE SAE AESI Maximum Grade Related/Not Related to study intervention CAB LA or CAB ULA Action/Dose Change Start Date (Time, if available) End Date (Time, if available)  Right ventrogluteal injection site tenderness NSAE AESI 2 Related CAB LA none 20MAR2025 at 1000 22MAR2025 at 1200  Headache NSAE 1 Related CAB LA none 20MAR2025 at 1000 22MAR2025 at 1200  Left ventrogluteal injection site tenderness NSAE 2 Related to CAB LA none 23APR2025 at 2200 27APR2025 at 0900  Nausea NSAE 1 Related to CAB LA none 26APR2025 at 0900  27APR2025 at 0900           Medical History Review  Diagnosis  Grade Start Date End Date  Stomach Ulcers 2 2021 2021  Seasonal allergies 2 2008   Dry Skin 2 05Jul2011                  [x]  []  Contraception Review: Report in [facility] Visits: M1, M2, M3, M4, M5, M6, M7, M8, M9, M11, M13, M17, M21,M25, M29, M31, M33, ED, 4WFU, LTFU Visits Male of childbearing potential: Must agree to use a highly effective method of contraception consistently and correctly, must also continue to use adequate contraception methods for at least 52 weeks after the last injection. Male condoms must be used in addition to hormonal contraception Sexual Abstinence: considered a highly effective method only if defined as refraining from heterosexual intercourse during the entire period of risk associated with the study intervention.  The reliability of sexual abstinence needs to be evaluated in relation to the duration of the study and the preferred and usual lifestyle of the participant.   Yes No/NA Completed:  []  [x]  Premature cardiovascular disease: male participant <65 years or male participant <55 years in first degree relatives only.  Note changes since the start of the study.     Months 5, 13, 21, 29      [] No Changes    []  [x]  Substance Usage: (list name of substance, amount used, start and stop dates)  Months 5, 65, 2, 74 Social History   Substance and Sexual Activity  Alcohol Use Not Currently   Comment: Started in 2017,    Social History   Substance and Sexual Activity  Drug Use No   Comment: Prior THC, Started 2014, stopped 01Feb2025    Tobacco Use: Low Risk  (09/01/2023)   Patient History    Smoking Tobacco Use: Never    Smokeless Tobacco Use: Never    Passive Exposure: Never    Caffeine Use:  caffeine containing beverages/day  []  [x]  Triplicate 12-lead ECG     Visit: M1, M3, M5, M9, M29 Perform in a semi-supine position  after 5 minutes of rest.  3 individual ECG tracing should be obtained as closely as possible in succession, but no more than 2 minutes  apart. The full set of triplicates should be completed in less than 6 minutes. If ECG abnormal clinically significant  then report as an AE/SAE.  Rest Start Time:  ECG #1 start time:     ECG #2:     ECG #3:    []  [x]  Vital Signs:  [] Weight, BMI (after 5 mins in a semi-supine position)    Only on Visits: M1, M3, M5, M9, M13, M17, M21, M25, M29, M33, ED, 4W FU, LTFU Visits  [] Height    Visit: M9  [] BP and Pulse (measured in a semi supine position after 5 minutes rest)     Visit: M1, M3, M5, M9, M13, M17, M21, M25, M29, M33, ED, 4WFU  [] Temperature and RR:    Visit: M13, ED  Rest Start Time:   Vital Signs Start Time:     There is no height or weight on file to calculate BMI.  There were no vitals filed for this visit.   []  [x]  Brief Symptom Directed Physical Examination: Will include at a minimum, assessments of skin, lungs, CV, abdomen (liver and spleen). Any abnormalities must be noted in the CRF (e.g current medical conditions or AE logs).     Visit: M1, M3, M5, M6, M7, M8, M9, M11, M13, M17, M21, M25, M29, M33, ED, 4W FU, LTFU   Physical Exam    Yes No Blood Draw Tracking:  All Visits  [x]  []  Verified previous Hgb to be >= 10 (if value <10, adjust volume per unit SOP)                          [] NA, healthy volunteer, previous Hgb results are unavailable  [x]  []  Does participant report non-study blood collection in the previous 2 months?  [x] Yes, estimated vol. 8mL    []  No  []  [x]  Assess Fasting Status.   Visits: M13, M33                                                      Is participant Fasting for at least 6 hours, overnight fast preferred?  [x]  No   []  Yes, Date/Time of last meal?   [x]  []  Blood Drawn.     Problems?  [x]  No   [] Yes, why?   Time: 0822    Total Vol: 4 mL   Yes No Lab Collection:  []  [x]  RAPID HIV TESTING        Visit: M1, M3, M5, M9, M13, M17, M21, M25, M29, M33, ED, 4WFU  [] REACTIVE or [] NON REACTIVE  If positive/reactive participant must be withdrawn from  the study even if subsequent confirmatory testing is negative. If reactive participant referred for further testing and clinical management as per local standard of care  Is participant experiencing any signs or symptoms consistent with acute HIV infection  [] Yes [x] No   []  [x]  Chemistry     Visits: M1, M3, M4, M5, M6, M7,M8, M9, M11, M13, M17, M21, M25, M29, M33, ED, 4WFU, LTFU  []  [x]  Hematology     Visits: M1, M3, M4, M5, M6, M7, M8, M9, M11, M13, M17, M21, M25, M29, M33, ED, 4WFU, LTFU  []  [  x] Non rapid HIV immunoassay    Visits: M1, M3, M5, M9, M13, M17, M21, M25, M29, M33, ED, 4WFU LTFU  []  [x]  HIV-1 RNA     Visits: M1, M3, M5, M9, M13, M17, M21, M25, M29, M33, ED, 4WFU, LTFU  []  [x]  Neisseria gonorrhea (GC), Chlamydia trachomatis (CT), Trichomononas vaginalis (TV), syphilis.  If positive we will refer for treatment as per local guidelines.   Visit: M1, M3, M5, M9, M13, M17, M21, M25, M29, M33, ED, 4WFU, LTFU, PRN   **PRN if symptomatic at other visits**  [] GC/CT NAAT urine/vaginal swab  *Rectal and oropharyngeal swab can be collected based on reported sexual history. [] GC and CT: Rectal Swab [] Oropharyngeal Swab [] TV: vaginal Swab  *If male and menstruating may take sample at next visit  []  [x]  Coagulation Tests     Visit: M13, M33  []  [x]  Hepatitis B&C     Visit: M13, M25, ED  []  [x]  Fasting Labs: Glucose     Visit: M13 and M33  (Insulin, HbA1c only collect if withdrawal visit occurs at M13 or M33)  [x]  []  Plasma for Storage     Visits: M1, M2, M3,M4, M5, M6, M7,M8, M9, M11, M13, M17, M21, M25, M29, ED, LTFU Visits  []  [x]  Predose: PK Sampling  Collect < 1 hr before injection Visits: M1, M3, M5, M9, M13, M17, M21, M25, M29  []  [x]  Illicit Drug Screen     Visits: M5, M13, M21, M29, M33  []  [x]  Urinalysis: Morning specimen is preferred     Visits: M13, M33, ED, 4WFU  Time of Collection:   LMP: No LMP for male patient.  [x]  N/A   Yes No/NA Lab Collection, Cont.:  []  [x]  Urine  Pregnancy Test (POCBP only)  Visits: M1, M3, M5, M9, M13, M17, M21, M25, M29, M33 (resulted prior to receipt of IP), ED, 4WFU, LTFU   Time of Collection:     [x] NA, assigned Male at birth or Not a POCBP  []                          [x]  Serum Pregnancy Test (POCBP only) only if urine test positive, perform a serum test to confirm.   Yes No/NA Study Drug Administration  []  [x]  CAB LA Administration:     Visit: M1        Needle Size: [] 1.5"  [] 2" (For BMI >/= 30)   There is no height or weight on file to calculate BMI. Gauge: [] 21G [] 22G [] 23G  Site: [] Left Ventrogluteal  [] Right Ventrogluteal  Time of Administration:    []  [x]  CAB ULA Administration:   Visit: M3, M5, M9, M13, M17, M21, M25, M29  Sites should be rotated between right and left Needle Size: [] 1.5"  [] 2" (For BMI >/= 30)   There is no height or weight on file to calculate BMI.  Gauge: [] 21G [] 22G [] 23G  Site: [] Left Ventrogluteal  [] Right Ventrogluteal  Time of Administration:    [x]  []  ISR Review completed, ISR photography necessary [] Yes[x] No     All Visits *Digital photographs will be documented at all visits (scheduled or unscheduled) on all participants who have an injection site reaction that is a visible, persistent Grade 2 (no improvement/resolution in >10 days), Grade >/=3, or serious. *Examination will include a physician/HCP assessment of pain (or tenderness), pruritus, warmth, infections, rash, erythema (or redness), swelling (or induration), and nodules (granulomas or cysts)  *Remote Visits: M6+2W, M7+2W, M8+2W, M10, M12, M15, M19,  M23, M27, M31 should assess whether participants have any new ISR symptoms/signs since their last visit or have previously reported ISR that has not improved or is worsening, if present site should perform an unscheduled visits ASAP for ISR assessment   Yes No/NA  Pharmacokinetic Sampling  [x]  []   Post Dose PK sampling: 2 hours after injection on dosing days(+/- 30 mins collection  window Visits:1 week, M1, M1+1Wk, M2, M3, M3+1Wk, M4, M5, M5+1W, M6, M7, M8, M9, , M9+1Wk, M11, M13, M33, ED, 4WFU, LTFU     Time of PK Collection: 0822    Yes No Completed:  [x]  []   HIV/STI  Counseling/Condom Distribution:    All Visits, except remote calls [x] Accepted  [] Declined   Yes No/NA Completed  [x]  []  Was there an ISR during remote phone call visit:  [] Yes [x]  No If yes, schedule unscheduled ISR assessment ASAP Date:   [x]  []  Instructed to contact site if ISR gets worse or does not improve after 10 days from the last ISR assessment.  [x]  []  Assess for Protocol Deviations  [x]  []  Reminders:  Participants of childbearing potential agree to use a highly effective method of contraception consistently and correctly HIV vaccines are not permitted at any time during the study No other experimental agents, antiretroviral drugs, cytotoxic chemotherapy, or radiation therapy my not be administered throughout the trial No systemically administered immunomodulator's permitted Notify study staff if you experience any signs/symptoms, visit provider in ER or scheduled evaluation, or start any new OTC, supplemental or prescription therapies   Yes No Completed:  [x]  []  Compensation:  You will receive a payment of $78.00 at the end of each non dosing visit completed to reimburse you for your time and effort.  You will receive $125.00 at the end of each dosing visit completed to reimburse you for your time and effort.   If you must return to the clinic for an unscheduled visit, you will receive $78.   Amount: $78.00   [x]  []  Schedule Next Visit: Visits should be planned using a calendar months and scheduled based on the date of the Day 1 injection.    Remote Call Visits schedule on M6+2 wk, M7+2wk, M8+2wk, M10, M12, M15, M19, M23, M27, M31 (Assess ISR, AES, Conmeds)  If transitioning to CAB 200mg /ml do not need LTFU visits, but do need ED visit and 4 week followup visit.  Date: 23JUN2025      Time: 0800   Version 2.1 01May2025 JLS

## 2023-09-27 ENCOUNTER — Encounter

## 2023-09-28 NOTE — Progress Notes (Signed)
 I have reviewed all of the labs, results and findings for this visit.

## 2023-09-29 NOTE — Progress Notes (Signed)
 I have reviewed the labs and data from this patients research encounter.

## 2023-10-28 ENCOUNTER — Telehealth: Payer: Self-pay

## 2023-10-28 NOTE — Telephone Encounter (Signed)
 Reminder text sent about visit on 23JUN2025 with [facility]

## 2023-10-31 ENCOUNTER — Encounter

## 2023-11-01 ENCOUNTER — Other Ambulatory Visit: Payer: Self-pay

## 2023-11-01 ENCOUNTER — Encounter

## 2023-11-01 VITALS — BP 123/79 | HR 72 | Wt 160.5 lb

## 2023-11-01 DIAGNOSIS — Z006 Encounter for examination for normal comparison and control in clinical research program: Secondary | ICD-10-CM

## 2023-11-01 MED ORDER — STUDY - EXTEND - CABOTEGRAVIR ULA (GSK1265744) 1600 MG IM INJECTION (PI-VAN DAM)
1600.0000 mg | INJECTION | INTRAMUSCULAR | Status: DC
  Administered 2023-11-01: 1600 mg via INTRAMUSCULAR
  Filled 2023-11-01: qty 3

## 2023-11-01 NOTE — Research (Addendum)
 Johnny Nelson [id number]                                                  Protocol Amendment 02  [facility]:  [phone]                                                                  TREATMENT PHASE  Visit: []  Day1+1W     []  M1    []  M1+1W    []  M2    [x]  M3    []  M3+1W    []  M4    [] M5     [] M5+1W   [] M6   [] M6+2W   [] M7   [] M7+2W   [] M8   [] M8+2W   [] M9 [] M9+1W   [] M10   [] M11   [] M12   [] M13   [] M15    [] M17   [] M19   [] M21   [] M23   [] M25   [] M27   [] M29   [] M31   [] M33   [] ED  [] 4 wk followup (if not entering LTFU)   [] LTFU M1   [] LTFU M4   [] LTFU M8   [] LTFU M12     Date: [id number]         4 Letter Code: MCEA Subject ID: [id number]  Assessments should occur in the following order:  eCSSRs  All other questionnaires  Triplicate 12-lead ECG  Vital signs  Blood draws (clinical laboratory tests, pre-dose pharmacokinetics, etc)  IP injection  Post-dose pharmacokinetics (if required)  Yes No Completed:  [x]  []  Verify participant identity before doing any study procedures   All Visits  [x]  []  Verify correct version of ICF is signed   All Visits   Yes No Questionnaires Completed: Patient Reported Outcome Assessment must be completed before other assessments take place at each designated visit. Completed electronically by participants.  [x]  []  eCSSRs    Visits: M1, M3, M5, M9, M13, M17, M21, M25, M29, M33, ED Any positive  (abnormal) response indicating SIB or any unusual changes in behavior, confirmed by the investigator will result in their discontinuation of study intervention, the PI/SI will arrange for urgent specialist psychiatric evaluation and management  []  [x]  Social Determinants of Health Questionnaire (SDoH)     Visits: M17, ED  [x]  []  Study Medication Satisfaction Questionnaire (SMSQs)     Visits: M1, M3, M5, M9, M13, M17, M21, M25, M29, M33, ED  []  [x]  PrEP Preference and Rationale Questionnaire (only if prior exposure to oral PrEP)     Visits: M9, M17, M25, M33, ED  [x]  []   Acceptability of Injection Questionnaire     Visits: M1, M3, M5, M9, M13, M17, M21, M25, M29, M33, ED  [x]  []  Generalized Anxiety Disorders (GAD-&) Questionnaire     Visits: M1, M3, M5, M9, M13, M17, M21, M25, M29, M33, ED  []  [x]  HIV-risk and PrEP use Anxiety and Stigma Questionnaire prior to the study drug administration     Visits: M17, M29, M33, ED  [x]  []  Sexual Health Practices Questionnaire-administer prior to study drug administration Visits: M1, M3, M5, M9, M13, M17, M21, M25, M29, M33, ED  Yes No Completed:  [x]  []  Review Changes in Medical History    All Visits  [x]  []  Review Changes in Concomitant Medications    All Visits  [x]  []  Assess for AEs, SAEs: All AEs (from Day 1 onwards) and SAEs (from Screening onwards) must be recorded in eCRF at all visits and reported within required timelines.    All Visits  Is participant experiencing any symptomatic STIs currently? [] Yes [x] No    [x]  []  Pull Review in: Medication Review Medication Indication Dose/Route/Frequency Start Date End Date  cetirizine  (Zyrtec )    Seasonal Allergies 10 mg tab PO QD 05Aug2023   Tylenol  Right ventrogluteal Injection Site tenderness 1000 mg PO once daily 20MAR2025 22MAR2025  Tylenol  Left Ventrogluteal Injection Site Tenderness 1000mg  PO once daily 26APR2025 26APR2025         AE Review Diagnosis/Symptoms Indicate: NSAE SAE AESI Maximum Grade Related/Not Related to study intervention CAB LA or CAB ULA Action/Dose Change Start Date (Time, if available) End Date (Time, if available)  Right ventrogluteal injection site tenderness NSAE  2 Related CAB LA none 20MAR2025 at 1000 22MAR2025 at 1200  Headache NSAE 1 Related CAB LA none 20MAR2025 at 1000 22MAR2025 at 1200  Left ventrogluteal injection site tenderness NSAE 2 Related to CAB LA none 23APR2025 at 2200 27APR2025 at 0900  Nausea NSAE 1 Related to CAB LA none 26APR2025 at 0900  27APR2025 at 0900           Medical History Review  Diagnosis Grade  Start Date End Date  Stomach Ulcers 2 2021 2021  Seasonal allergies 2 2008   Dry Skin 2 05Jul2011                  [x]  []  Contraception Review: Report in [facility] Visits: M1, M2, M3, M4, M5, M6, M7, M8, M9, M11, M13, M17, M21,M25, M29, M31, M33, ED, 4WFU, LTFU Visits Male of childbearing potential: Must agree to use a highly effective method of contraception consistently and correctly, must also continue to use adequate contraception methods for at least 52 weeks after the last injection. Male condoms must be used in addition to hormonal contraception Sexual Abstinence: considered a highly effective method only if defined as refraining from heterosexual intercourse during the entire period of risk associated with the study intervention.  The reliability of sexual abstinence needs to be evaluated in relation to the duration of the study and the preferred and usual lifestyle of the participant.   Yes No/NA Completed:  []  [x]  Premature cardiovascular disease: male participant <65 years or male participant <55 years in first degree relatives only.  Note changes since the start of the study.     Months 5, 13, 21, 29      [] No Changes    []  [x]  Substance Usage: (list name of substance, amount used, start and stop dates)  Months 5, 58, 19, 74 Social History   Substance and Sexual Activity  Alcohol Use Not Currently   Comment: Started in 2017,    Social History   Substance and Sexual Activity  Drug Use No   Comment: Prior THC, Started 2014, stopped 01Feb2025    Tobacco Use: Low Risk  (11/01/2023)   Patient History    Smoking Tobacco Use: Never    Smokeless Tobacco Use: Never    Passive Exposure: Never    Caffeine Use:  caffeine containing beverages/day  [x]  []  Triplicate 12-lead ECG     Visit: M1, M3, M5, M9, M29 Perform in a semi-supine position  after 5 minutes of rest.  3 individual ECG tracing should be obtained as closely as possible in succession, but no more than 2 minutes  apart. The full set of triplicates should be completed in less than 6 minutes. If ECG abnormal clinically significant  then report as an AE/SAE.  Rest Start Time: 0951 ECG #1 start time: 1003    ECG #2: 1004    ECG #3: 1005   [x]  []  Vital Signs:  [x] Weight, BMI (after 5 mins in a semi-supine position)    Only on Visits: M1, M3, M5, M9, M13, M17, M21, M25, M29, M33, ED, 4W FU, LTFU Visits  [] Height    Visit: M9  [x] BP and Pulse (measured in a semi supine position after 5 minutes rest)     Visit: M1, M3, M5, M9, M13, M17, M21, M25, M29, M33, ED, 4WFU  [] Temperature and RR:    Visit: M13, ED  Rest Start Time: 1005  Vital Signs Start Time: 1012  Last Weight  Most recent update: 11/01/2023  9:29 AM    Weight  72.8 kg (160 lb 7.9 oz)             Body mass index is 20.06 kg/m.  Vitals:   11/01/23 1012  BP: 123/79  Pulse: 72     [x]  []  Brief Symptom Directed Physical Examination: Will include at a minimum, assessments of skin, lungs, CV, abdomen (liver and spleen). Any abnormalities must be noted in the CRF (e.g current medical conditions or AE logs).     Visit: M1, M3, M5, M6, M7, M8, M9, M11, M13, M17, M21, M25, M29, M33, ED, 4W FU, LTFU   Physical Exam Constitutional:      Appearance: Normal appearance. He is normal weight.     Comments: Ppt is attempting to gain weight by eating more regular meals and plans to begin to work out   Cardiovascular:     Rate and Rhythm: Normal rate and regular rhythm.  Pulmonary:     Effort: Pulmonary effort is normal.     Breath sounds: Normal breath sounds.  Abdominal:     General: Abdomen is flat. There is no distension.     Palpations: Abdomen is soft.     Tenderness: There is no abdominal tenderness.   Musculoskeletal:        General: Normal range of motion.   Neurological:     Mental Status: He is alert.   Psychiatric:        Mood and Affect: Mood normal.        Behavior: Behavior normal.        Thought Content: Thought content  normal.        Judgment: Judgment normal.       Yes No Blood Draw Tracking:  All Visits  [x]  []  Verified previous Hgb to be >= 10 (if value <10, adjust volume per unit SOP)                          [] NA, healthy volunteer, previous Hgb results are unavailable  [x]  []  Does participant report non-study blood collection in the previous 2 months?  [] Yes, estimated vol. mL    [x]  No  []  [x]  Assess Fasting Status.   Visits: M13, M33  Is participant Fasting for at least 6 hours, overnight fast preferred?  []  No   []  Yes, Date/Time of last meal?   [x]  []  Blood Drawn.     Problems?  []  No   [x] Yes, why? Stuck three times  Time: 1032    Total Vol: 40 mL   Yes No Lab Collection:  [x]  []  RAPID HIV TESTING        Visit: M1, M3, M5, M9, M13, M17, M21, M25, M29, M33, ED, 4WFU  [] REACTIVE or [x] NON REACTIVE  If positive/reactive participant must be withdrawn from the study even if subsequent confirmatory testing is negative. If reactive participant referred for further testing and clinical management as per local standard of care  Is participant experiencing any signs or symptoms consistent with acute HIV infection  [] Yes [x] No   [x]  []  Chemistry     Visits: M1, M3, M4, M5, M6, M7,M8, M9, M11, M13, M17, M21, M25, M29, M33, ED, 4WFU, LTFU  [x]  []  Hematology     Visits: M1, M3, M4, M5, M6, M7, M8, M9, M11, M13, M17, M21, M25, M29, M33, ED, 4WFU, LTFU  [x]  []  Non rapid HIV immunoassay    Visits: M1, M3, M5, M9, M13, M17, M21, M25, M29, M33, ED, 4WFU LTFU  [x]  []  HIV-1 RNA     Visits: M1, M3, M5, M9, M13, M17, M21, M25, M29, M33, ED, 4WFU, LTFU  [x]  []  Neisseria gonorrhea (GC), Chlamydia trachomatis (CT), Trichomononas vaginalis (TV), syphilis.  If positive we will refer for treatment as per local guidelines.   Visit: M1, M3, M5, M9, M13, M17, M21, M25, M29, M33, ED, 4WFU, LTFU, PRN   **PRN if symptomatic at other visits**  [x] GC/CT NAAT  urine/vaginal swab  *Rectal and oropharyngeal swab can be collected based on reported sexual history. [x] GC and CT: Rectal Swab [x] Oropharyngeal Swab [] TV: vaginal Swab  *If male and menstruating may take sample at next visit  []  [x]  Coagulation Tests     Visit: M13, M33  []  [x]  Hepatitis B&C     Visit: M13, M25, ED  []  [x]  Fasting Labs: Glucose     Visit: M13 and M33  (Insulin, HbA1c only collect if withdrawal visit occurs at M13 or M33)  [x]  []  Plasma for Storage     Visits: M1, M2, M3,M4, M5, M6, M7,M8, M9, M11, M13, M17, M21, M25, M29, ED, LTFU Visits  [x]  []  Predose: PK Sampling  Collect < 1 hr before injection Visits: M1, M3, M5, M9, M13, M17, M21, M25, M29  []  [x]  Illicit Drug Screen     Visits: M5, M13, M21, M29, M33  []  [x]  Urinalysis: Morning specimen is preferred     Visits: M13, M33, ED, 4WFU  Time of Collection:   LMP: No LMP for male patient.  []  N/A   Yes No/NA Lab Collection, Cont.:  []  [x]  Urine Pregnancy Test (POCBP only)  Visits: M1, M3, M5, M9, M13, M17, M21, M25, M29, M33 (resulted prior to receipt of IP), ED, 4WFU, LTFU   Time of Collection:     [x] NA, assigned Male at birth or Not a POCBP  []                          [x]  Serum Pregnancy Test (POCBP only) only if urine test positive, perform a serum test to confirm.   Yes No/NA Study Drug Administration  []  [x]  CAB LA Administration:     Visit: M1  Needle Size: [] 1.5  [] 2 (For BMI >/= 30)   Body mass index is 20.06 kg/m. Gauge: [] 21G [] 22G [] 23G  Site: [] Left Ventrogluteal  [] Right Ventrogluteal  Time of Administration:    [x]  []  CAB ULA Administration:   Visit: M3, M5, M9, M13, M17, M21, M25, M29  Sites should be rotated between right and left Needle Size: [x] 1.5  [] 2 (For BMI >/= 30)   Body mass index is 20.06 kg/m.  Gauge: [] 21G [x] 22G [] 23G  Site: [] Left Ventrogluteal  [x] Right Ventrogluteal  Time of Administration: 1047   [x]  []  ISR Review completed, ISR photography necessary  [] Yes[x] No     All Visits *Digital photographs will be documented at all visits (scheduled or unscheduled) on all participants who have an injection site reaction that is a visible, persistent Grade 2 (no improvement/resolution in >10 days), Grade >/=3, or serious. *Examination will include a physician/HCP assessment of pain (or tenderness), pruritus, warmth, infections, rash, erythema (or redness), swelling (or induration), and nodules (granulomas or cysts)  *Remote Visits: M6+2W, M7+2W, M8+2W, M10, M12, M15, M19, M23, M27, M31 should assess whether participants have any new ISR symptoms/signs since their last visit or have previously reported ISR that has not improved or is worsening, if present site should perform an unscheduled visits ASAP for ISR assessment   Yes No/NA  Pharmacokinetic Sampling  [x]  []   Post Dose PK sampling: 2 hours after injection on dosing days(+/- 30 mins collection window Visits:1 week, M1, M1+1Wk, M2, M3, M3+1Wk, M4, M5, M5+1W, M6, M7, M8, M9, , M9+1Wk, M11, M13, M33, ED, 4WFU, LTFU   Time of Injection: 1047  Time of PK Collection: 1242    Yes No Completed:  [x]  []   HIV/STI  Counseling/Condom Distribution:    All Visits, except remote calls [x] Accepted  [] Declined   Yes No/NA Completed  []  [x]  Was there an ISR during remote phone call visit:  [] Yes [x]  No If yes, schedule unscheduled ISR assessment ASAP Date:   [x]  []  Instructed to contact site if ISR gets worse or does not improve after 10 days from the last ISR assessment.  [x]  []  Assess for Protocol Deviations  [x]  []  Reminders:  Participants of childbearing potential agree to use a highly effective method of contraception consistently and correctly HIV vaccines are not permitted at any time during the study No other experimental agents, antiretroviral drugs, cytotoxic chemotherapy, or radiation therapy my not be administered throughout the trial No systemically administered immunomodulator's  permitted Notify study staff if you experience any signs/symptoms, visit provider in ER or scheduled evaluation, or start any new OTC, supplemental or prescription therapies   Yes No Completed:  [x]  []  Compensation:  You will receive a payment of $78.00 at the end of each non dosing visit completed to reimburse you for your time and effort.  You will receive $125.00 at the end of each dosing visit completed to reimburse you for your time and effort.   If you must return to the clinic for an unscheduled visit, you will receive $78.   Amount: $125.00   [x]  []  Schedule Next Visit: Visits should be planned using a calendar months and scheduled based on the date of the Day 1 injection.    Remote Call Visits schedule on M6+2 wk, M7+2wk, M8+2wk, M10, M12, M15, M19, M23, M27, M31 (Assess ISR, AES, Conmeds)  If transitioning to CAB 200mg /ml do not need LTFU visits, but do need ED visit and 4 week followup visit.  Date: 01JUL2025  Time: 0900   Version 2.1 01May2025 JLS

## 2023-11-07 ENCOUNTER — Encounter

## 2023-11-07 NOTE — Progress Notes (Signed)
 I have reviewed the labs and data from the patients visit.

## 2023-11-07 NOTE — Research (Signed)
 Johnny Nelson

## 2023-11-08 ENCOUNTER — Encounter: Admitting: *Deleted

## 2023-11-08 ENCOUNTER — Other Ambulatory Visit: Payer: Self-pay

## 2023-11-08 DIAGNOSIS — Z006 Encounter for examination for normal comparison and control in clinical research program: Secondary | ICD-10-CM

## 2023-11-08 NOTE — Research (Signed)
 [name] [id number]                                                  Protocol Amendment 02  [facility]:  [phone]                                                                  TREATMENT PHASE  Visit: []  Day1+1W     []  M1    []  M1+1W    []  M2    []  M3    [x]  M3+1W    []  M4    [] M5     [] M5+1W   [] M6   [] M6+2W   [] M7   [] M7+2W   [] M8   [] M8+2W   [] M9 [] M9+1W   [] M10   [] M11   [] M12   [] M13   [] M15    [] M17   [] M19   [] M21   [] M23   [] M25   [] M27   [] M29   [] M31   [] M33   [] ED  [] 4 wk followup (if not entering LTFU)   [] LTFU M1   [] LTFU M4   [] LTFU M8   [] LTFU M12     Date: 01Jul2025         4 Letter Code: MCEA Subject ID: [id number]  Assessments should occur in the following order:  eCSSRs  All other questionnaires  Triplicate 12-lead ECG  Vital signs  Blood draws (clinical laboratory tests, pre-dose pharmacokinetics, etc)  IP injection  Post-dose pharmacokinetics (if required)  Yes No Completed:  [x]  []  Verify participant identity before doing any study procedures   All Visits  [x]  []  Verify correct version of ICF is signed   All Visits   Yes No Questionnaires Completed: Patient Reported Outcome Assessment must be completed before other assessments take place at each designated visit. Completed electronically by participants.  []  [x]  eCSSRs    Visits: M1, M3, M5, M9, M13, M17, M21, M25, M29, M33, ED Any positive  (abnormal) response indicating SIB or any unusual changes in behavior, confirmed by the investigator will result in their discontinuation of study intervention, the PI/SI will arrange for urgent specialist psychiatric evaluation and management  []  [x]  Social Determinants of Health Questionnaire (SDoH)     Visits: M17, ED  []  [x]  Study Medication Satisfaction Questionnaire (SMSQs)     Visits: M1, M3, M5, M9, M13, M17, M21, M25, M29, M33, ED  []  [x]  PrEP Preference and Rationale Questionnaire (only if prior exposure to oral PrEP)     Visits: M9, M17, M25, M33, ED  []  [x]   Acceptability of Injection Questionnaire     Visits: M1, M3, M5, M9, M13, M17, M21, M25, M29, M33, ED  []  [x]  Generalized Anxiety Disorders (GAD-&) Questionnaire     Visits: M1, M3, M5, M9, M13, M17, M21, M25, M29, M33, ED  []  [x]  HIV-risk and PrEP use Anxiety and Stigma Questionnaire prior to the study drug administration     Visits: M17, M29, M33, ED  []  [x]  Sexual Health Practices Questionnaire-administer prior to study drug administration Visits: M1, M3, M5, M9, M13, M17, M21, M25, M29, M33, ED  Yes No Completed:  [x]  []  Review Changes in Medical History    All Visits  [x]  []  Review Changes in Concomitant Medications    All Visits  [x]  []  Assess for AEs, SAEs: All AEs (from Day 1 onwards) and SAEs (from Screening onwards) must be recorded in eCRF at all visits and reported within required timelines.    All Visits  Is participant experiencing any symptomatic STIs currently? [] Yes [] No    [x]  []  Pull Review in: Medication Review Medication Indication Dose/Route/Frequency Start Date End Date  cetirizine  (Zyrtec )    Seasonal Allergies 10 mg tab PO QD 05Aug2023   Tylenol  Right ventrogluteal Injection Site tenderness 1000 mg PO once daily 20MAR2025 22MAR2025  Tylenol  Left Ventrogluteal Injection Site Tenderness 1000mg  PO once daily 26APR2025 26APR2025         AE Review Diagnosis/Symptoms Indicate: NSAE SAE AESI Maximum Grade Related/Not Related to study intervention CAB LA or CAB ULA Action/Dose Change Start Date (Time, if available) End Date (Time, if available)  Right ventrogluteal injection site tenderness NSAE  2 Related CAB LA none 20MAR2025 at 1000 22MAR2025 at 1200  Headache NSAE 1 Related CAB LA none 20MAR2025 at 1000 22MAR2025 at 1200  Left ventrogluteal injection site tenderness NSAE 2 Related to CAB LA none 23APR2025 at 2200 27APR2025 at 0900  Nausea NSAE 1 Related to CAB LA none 26APR2025 at 0900  27APR2025 at 0900  ISR (R) ventrogluteal tenderness NSAE 1 Related to ULA  CAB none 25Jun2025 At 1000 29Jun2025 at 1000           Medical History Review  Diagnosis Grade Start Date End Date  Stomach Ulcers 2 2021 2021  Seasonal allergies 2 2008   Dry Skin 2 05Jul2011   Difficulty Sleeping 09 July 2022             [x]  []  Contraception Review: Report in [facility] Visits: M1, M2, M3, M4, M5, M6, M7, M8, M9, M11, M13, M17, M21,M25, M29, M31, M33, ED, 4WFU, LTFU Visits Male of childbearing potential: Must agree to use a highly effective method of contraception consistently and correctly, must also continue to use adequate contraception methods for at least 52 weeks after the last injection. Male condoms must be used in addition to hormonal contraception Sexual Abstinence: considered a highly effective method only if defined as refraining from heterosexual intercourse during the entire period of risk associated with the study intervention.  The reliability of sexual abstinence needs to be evaluated in relation to the duration of the study and the preferred and usual lifestyle of the participant.   Yes No/NA Completed:  []  [x]  Premature cardiovascular disease: male participant <65 years or male participant <55 years in first degree relatives only.  Note changes since the start of the study.     Months 5, 13, 21, 29      [] No Changes    []  [x]  Substance Usage: (list name of substance, amount used, start and stop dates)  Months 5, 27, 79, 86 Social History   Substance and Sexual Activity  Alcohol Use Not Currently   Comment: Started in 2017,    Social History   Substance and Sexual Activity  Drug Use No   Comment: Prior THC, Started 2014, stopped 01Feb2025    Tobacco Use: Low Risk  (11/01/2023)   Patient History    Smoking Tobacco Use: Never    Smokeless Tobacco Use: Never    Passive Exposure: Never    Caffeine Use:  caffeine containing beverages/day  []  [  x] Triplicate 12-lead ECG     Visit: M1, M3, M5, M9, M29 Perform in a semi-supine position after 5  minutes of rest.  3 individual ECG tracing should be obtained as closely as possible in succession, but no more than 2 minutes apart. The full set of triplicates should be completed in less than 6 minutes. If ECG abnormal clinically significant  then report as an AE/SAE.  Rest Start Time:  ECG #1 start time:     ECG #2:     ECG #3:    []  [x]  Vital Signs:  [] Weight, BMI (after 5 mins in a semi-supine position)    Only on Visits: M1, M3, M5, M9, M13, M17, M21, M25, M29, M33, ED, 4W FU, LTFU Visits  [] Height    Visit: M9  [] BP and Pulse (measured in a semi supine position after 5 minutes rest)     Visit: M1, M3, M5, M9, M13, M17, M21, M25, M29, M33, ED, 4WFU  [] Temperature and RR:    Visit: M13, ED  Rest Start Time:   Vital Signs Start Time:     There is no height or weight on file to calculate BMI.  There were no vitals filed for this visit.   []  [x]  Brief Symptom Directed Physical Examination: Will include at a minimum, assessments of skin, lungs, CV, abdomen (liver and spleen). Any abnormalities must be noted in the CRF (e.g current medical conditions or AE logs).     Visit: M1, M3, M5, M6, M7, M8, M9, M11, M13, M17, M21, M25, M29, M33, ED, 4W FU, LTFU   Physical Exam    Yes No Blood Draw Tracking:  All Visits  [x]  []  Verified previous Hgb to be >= 10 (if value <10, adjust volume per unit SOP)                          [] NA, healthy volunteer, previous Hgb results are unavailable  [x]  []  Does participant report non-study blood collection in the previous 2 months?  [] Yes, estimated vol. mL    [x]  No  []  [x]  Assess Fasting Status.   Visits: M13, M33                                                      Is participant Fasting for at least 6 hours, overnight fast preferred?  []  No   []  Yes, Date/Time of last meal?   [x]  []  Blood Drawn.     Problems?  [x]  No   [] Yes, why?   Time: 0910    Total Vol: 2.0 mL   Yes No Lab Collection:  []  [x]  RAPID HIV TESTING        Visit: M1, M3, M5, M9,  M13, M17, M21, M25, M29, M33, ED, 4WFU  [] REACTIVE or [] NON REACTIVE  If positive/reactive participant must be withdrawn from the study even if subsequent confirmatory testing is negative. If reactive participant referred for further testing and clinical management as per local standard of care  Is participant experiencing any signs or symptoms consistent with acute HIV infection  [] Yes [] No   []  [x]  Chemistry     Visits: M1, M3, M4, M5, M6, M7,M8, M9, M11, M13, M17, M21, M25, M29, M33, ED, 4WFU, LTFU  []  [x]  Hematology     Visits:  M1, M3, M4, M5, M6, M7, M8, M9, M11, M13, M17, M21, M25, M29, M33, ED, 4WFU, LTFU  []  [x]  Non rapid HIV immunoassay    Visits: M1, M3, M5, M9, M13, M17, M21, M25, M29, M33, ED, 4WFU LTFU  []  [x]  HIV-1 RNA     Visits: M1, M3, M5, M9, M13, M17, M21, M25, M29, M33, ED, 4WFU, LTFU  []  [x]  Neisseria gonorrhea (GC), Chlamydia trachomatis (CT), Trichomononas vaginalis (TV), syphilis.  If positive we will refer for treatment as per local guidelines.   Visit: M1, M3, M5, M9, M13, M17, M21, M25, M29, M33, ED, 4WFU, LTFU, PRN   **PRN if symptomatic at other visits**  [] GC/CT NAAT urine/vaginal swab  *Rectal and oropharyngeal swab can be collected based on reported sexual history. [] GC and CT: Rectal Swab [] Oropharyngeal Swab [] TV: vaginal Swab  *If male and menstruating may take sample at next visit  []  [x]  Coagulation Tests     Visit: M13, M33  []  [x]  Hepatitis B&C     Visit: M13, M25, ED  []  [x]  Fasting Labs: Glucose     Visit: M13 and M33  (Insulin, HbA1c only collect if withdrawal visit occurs at M13 or M33)  []  [x]  Plasma for Storage     Visits: M1, M2, M3,M4, M5, M6, M7,M8, M9, M11, M13, M17, M21, M25, M29, ED, LTFU Visits  []  [x]  Predose: PK Sampling  Collect < 1 hr before injection Visits: M1, M3, M5, M9, M13, M17, M21, M25, M29  []  [x]  Illicit Drug Screen     Visits: M5, M13, M21, M29, M33  []  [x]  Urinalysis: Morning specimen is preferred     Visits: M13,  M33, ED, 4WFU  Time of Collection:   LMP: No LMP for male patient.  []  N/A   Yes No/NA Lab Collection, Cont.:  []  [x]  Urine Pregnancy Test (POCBP only)  Visits: M1, M3, M5, M9, M13, M17, M21, M25, M29, M33 (resulted prior to receipt of IP), ED, 4WFU, LTFU   Time of Collection:     [x] NA, assigned Male at birth or Not a POCBP  []                          [x]  Serum Pregnancy Test (POCBP only) only if urine test positive, perform a serum test to confirm.   Yes No/NA Study Drug Administration  []  [x]  CAB LA Administration:     Visit: M1        Needle Size: [] 1.5  [] 2 (For BMI >/= 30)   There is no height or weight on file to calculate BMI. Gauge: [] 21G [] 22G [] 23G  Site: [] Left Ventrogluteal  [] Right Ventrogluteal  Time of Administration:    []  [x]  CAB ULA Administration:   Visit: M3, M5, M9, M13, M17, M21, M25, M29  Sites should be rotated between right and left Needle Size: [] 1.5  [] 2 (For BMI >/= 30)   There is no height or weight on file to calculate BMI.  Gauge: [] 21G [] 22G [] 23G  Site: [] Left Ventrogluteal  [] Right Ventrogluteal  Time of Administration:    [x]  []  ISR Review completed, ISR photography necessary [] Yes[x] No     All Visits *Digital photographs will be documented at all visits (scheduled or unscheduled) on all participants who have an injection site reaction that is a visible, persistent Grade 2 (no improvement/resolution in >10 days), Grade >/=3, or serious. *Examination will include a physician/HCP assessment of pain (or tenderness), pruritus, warmth, infections, rash, erythema (  or redness), swelling (or induration), and nodules (granulomas or cysts)  *Remote Visits: M6+2W, M7+2W, M8+2W, M10, M12, M15, M19, M23, M27, M31 should assess whether participants have any new ISR symptoms/signs since their last visit or have previously reported ISR that has not improved or is worsening, if present site should perform an unscheduled visits ASAP for ISR assessment   Yes  No/NA  Pharmacokinetic Sampling  [x]  []   Post Dose PK sampling: 2 hours after injection on dosing days(+/- 30 mins collection window Visits:1 week, M1, M1+1Wk, M2, M3, M3+1Wk, M4, M5, M5+1W, M6, M7, M8, M9, , M9+1Wk, M11, M13, M33, ED, 4WFU, LTFU   Time of Injection: 24Jun2025 @ 1047  Time of PK Collection: 0910    Yes No Completed:  [x]  []   HIV/STI  Counseling/Condom Distribution:    All Visits, except remote calls [] Accepted  [x] Declined   Yes No/NA Completed  []  [x]  Was there an ISR during remote phone call visit:  [] Yes []  No If yes, schedule unscheduled ISR assessment ASAP Date:   []  [x]  Instructed to contact site if ISR gets worse or does not improve after 10 days from the last ISR assessment.  [x]  []  Assess for Protocol Deviations  [x]  []  Reminders:  Participants of childbearing potential agree to use a highly effective method of contraception consistently and correctly HIV vaccines are not permitted at any time during the study No other experimental agents, antiretroviral drugs, cytotoxic chemotherapy, or radiation therapy my not be administered throughout the trial No systemically administered immunomodulator's permitted Notify study staff if you experience any signs/symptoms, visit provider in ER or scheduled evaluation, or start any new OTC, supplemental or prescription therapies   Yes No Completed:  [x]  []  Compensation:  You will receive a payment of $78.00 at the end of each non dosing visit completed to reimburse you for your time and effort.  You will receive $125.00 at the end of each dosing visit completed to reimburse you for your time and effort.   If you must return to the clinic for an unscheduled visit, you will receive $78.   Amount: 78   [x]  []  Schedule Next Visit: Visits should be planned using a calendar months and scheduled based on the date of the Day 1 injection.    Remote Call Visits schedule on M6+2 wk, M7+2wk, M8+2wk, M10, M12, M15, M19, M23, M27,  M31 (Assess ISR, AES, Conmeds)  If transitioning to CAB 200mg /ml do not need LTFU visits, but do need ED visit and 4 week followup visit.  Date: 22Jul2025     Time: 0900   Version 2.1 01May2025 JLS

## 2023-11-14 NOTE — Progress Notes (Signed)
 I have reviewed pertinent findings including labs for participants M3 + 1 week visit.

## 2023-11-14 NOTE — Progress Notes (Signed)
 I have reviewed pertinent findings including labs for participants M1 + 1 week visit.

## 2023-11-29 ENCOUNTER — Encounter

## 2023-11-29 ENCOUNTER — Other Ambulatory Visit: Payer: Self-pay

## 2023-11-29 DIAGNOSIS — Z006 Encounter for examination for normal comparison and control in clinical research program: Secondary | ICD-10-CM

## 2023-11-29 NOTE — Research (Addendum)
 [name] [id number]                                                  Protocol Amendment 02  [facility]:  [phone]                                                                  TREATMENT PHASE  Visit: []  Day1+1W     []  M1    []  M1+1W    []  M2    []  M3    []  M3+1W    [x]  M4    [] M5     [] M5+1W   [] M6   [] M6+2W   [] M7   [] M7+2W   [] M8   [] M8+2W   [] M9 [] M9+1W   [] M10   [] M11   [] M12   [] M13   [] M15    [] M17   [] M19   [] M21   [] M23   [] M25   [] M27   [] M29   [] M31   [] M33   [] ED  [] 4 wk followup (if not entering LTFU)   [] LTFU M1   [] LTFU M4   [] LTFU M8   [] LTFU M12     Date: 21JUL2025         4 Letter Code: MCEA Subject ID: [id number]  Assessments should occur in the following order:  eCSSRs  All other questionnaires  Triplicate 12-lead ECG  Vital signs  Blood draws (clinical laboratory tests, pre-dose pharmacokinetics, etc)  IP injection  Post-dose pharmacokinetics (if required)  Yes No Completed:  [x]  []  Verify participant identity before doing any study procedures   All Visits  [x]  []  Verify correct version of ICF is signed   All Visits   Yes No Questionnaires Completed: Patient Reported Outcome Assessment must be completed before other assessments take place at each designated visit. Completed electronically by participants.  []  [x]  eCSSRs    Visits: M1, M3, M5, M9, M13, M17, M21, M25, M29, M33, ED Any positive  (abnormal) response indicating SIB or any unusual changes in behavior, confirmed by the investigator will result in their discontinuation of study intervention, the PI/SI will arrange for urgent specialist psychiatric evaluation and management  []  [x]  Social Determinants of Health Questionnaire (SDoH)     Visits: M17, ED  []  [x]  Study Medication Satisfaction Questionnaire (SMSQs)     Visits: M1, M3, M5, M9, M13, M17, M21, M25, M29, M33, ED  []  [x]  PrEP Preference and Rationale Questionnaire (only if prior exposure to oral PrEP)     Visits: M9, M17, M25, M33, ED  []  [x]   Acceptability of Injection Questionnaire     Visits: M1, M3, M5, M9, M13, M17, M21, M25, M29, M33, ED  []  [x]  Generalized Anxiety Disorders (GAD-&) Questionnaire     Visits: M1, M3, M5, M9, M13, M17, M21, M25, M29, M33, ED  []  [x]  HIV-risk and PrEP use Anxiety and Stigma Questionnaire prior to the study drug administration     Visits: M17, M29, M33, ED  []  [x]  Sexual Health Practices Questionnaire-administer prior to study drug administration Visits: M1, M3, M5, M9, M13, M17, M21, M25, M29, M33, ED  Yes No Completed:  [x]  []  Review Changes in Medical History    All Visits  [x]  []  Review Changes in Concomitant Medications    All Visits  [x]  []  Assess for AEs, SAEs: All AEs (from Day 1 onwards) and SAEs (from Screening onwards) must be recorded in eCRF at all visits and reported within required timelines.    All Visits  Is participant experiencing any symptomatic STIs currently? [x] Yes [] No    [x]  []  Pull Review in: Medication Review Medication Review Medication Indication Dose/Route/Frequency Start Date End Date  cetirizine  (Zyrtec )    Seasonal Allergies 10 mg tab PO QD 05Aug2023   Tylenol  Right ventrogluteal Injection Site tenderness 1000 mg PO once daily 20MAR2025 22MAR2025  Tylenol  Left Ventrogluteal Injection Site Tenderness 1000mg  PO once daily 26APR2025 26APR2025         AE Review Diagnosis/Symptoms Indicate: NSAE SAE AESI Maximum Grade Related/Not Related to study intervention CAB LA or CAB ULA Action/Dose Change Start Date (Time, if available) End Date (Time, if available)  Right ventrogluteal injection site tenderness NSAE  2 Related CAB LA none 20MAR2025 at 1000 22MAR2025 at 1200  Headache NSAE 1 Related CAB LA none 20MAR2025 at 1000 22MAR2025 at 1200  Left ventrogluteal injection site tenderness NSAE 2 Related to CAB LA none 23APR2025 at 2200 27APR2025 at 0900  Nausea NSAE 1 Related to CAB LA none 26APR2025 at 0900  27APR2025 at 0900  ISR (R) ventrogluteal tenderness  NSAE 1 Related to ULA CAB  none 25Jun2025 at 1000 29Jun2025 at 1000           Medical History Review  Diagnosis Grade Start Date End Date  Stomach Ulcers 2 2021 2021  Seasonal allergies 2 2008   Dry Skin 2 05Jul2011                     [x]  []  Contraception Review: Report in [facility] Visits: M1, M2, M3, M4, M5, M6, M7, M8, M9, M11, M13, M17, M21,M25, M29, M31, M33, ED, 4WFU, LTFU Visits Male of childbearing potential: Must agree to use a highly effective method of contraception consistently and correctly, must also continue to use adequate contraception methods for at least 52 weeks after the last injection. Male condoms must be used in addition to hormonal contraception Sexual Abstinence: considered a highly effective method only if defined as refraining from heterosexual intercourse during the entire period of risk associated with the study intervention.  The reliability of sexual abstinence needs to be evaluated in relation to the duration of the study and the preferred and usual lifestyle of the participant.   Yes No/NA Completed:  []  [x]  Premature cardiovascular disease: male participant <65 years or male participant <55 years in first degree relatives only.  Note changes since the start of the study.     Months 5, 13, 21, 29      [] No Changes    []  [x]  Substance Usage: (list name of substance, amount used, start and stop dates)  Months 5, 36, 53, 59 Social History   Substance and Sexual Activity  Alcohol Use Not Currently   Comment: Started in 2017,    Social History   Substance and Sexual Activity  Drug Use No   Comment: Prior THC, Started 2014, stopped 01Feb2025    Tobacco Use: Low Risk  (11/01/2023)   Patient History    Smoking Tobacco Use: Never    Smokeless Tobacco Use: Never    Passive Exposure: Never    Caffeine Use:  caffeine containing beverages/day  []  [x]  Triplicate 12-lead ECG     Visit: M1, M3, M5, M9, M29 Perform in a semi-supine position after 5  minutes of rest.  3 individual ECG tracing should be obtained as closely as possible in succession, but no more than 2 minutes apart. The full set of triplicates should be completed in less than 6 minutes. If ECG abnormal clinically significant  then report as an AE/SAE.  Rest Start Time:  ECG #1 start time:    ECG #2:     ECG #3:    []  [x]  Vital Signs:  [] Weight, BMI (after 5 mins in a semi-supine position)    Only on Visits: M1, M3, M5, M9, M13, M17, M21, M25, M29, M33, ED, 4W FU, LTFU Visits  [] Height    Visit: M9  [] BP and Pulse (measured in a semi supine position after 5 minutes rest)     Visit: M1, M3, M5, M9, M13, M17, M21, M25, M29, M33, ED, 4WFU  [] Temperature and RR:    Visit: M13, ED  Rest Start Time:   Vital Signs Start Time:     There is no height or weight on file to calculate BMI.  There were no vitals filed for this visit.   []  [x]  Brief Symptom Directed Physical Examination: Will include at a minimum, assessments of skin, lungs, CV, abdomen (liver and spleen). Any abnormalities must be noted in the CRF (e.g current medical conditions or AE logs).     Visit: M1, M3, M5, M6, M7, M8, M9, M11, M13, M17, M21, M25, M29, M33, ED, 4W FU, LTFU   Physical Exam    Yes No Blood Draw Tracking:  All Visits  [x]  []  Verified previous Hgb to be >= 10 (if value <10, adjust volume per unit SOP)                          [] NA, healthy volunteer, previous Hgb results are unavailable  []  [x]  Does participant report non-study blood collection in the previous 2 months?  [] Yes, estimated vol.     [x]  No  []  [x]  Assess Fasting Status.   Visits: M13, M33                                                      Is participant Fasting for at least 6 hours, overnight fast preferred?  [x]  No   []  Yes, Date/Time of last meal?   [x]  []  Blood Drawn.     Problems?  [x]  No   [] Yes, why?   Time: 0905    Total Vol: 14 mL   Yes No Lab Collection:  []  [x]  RAPID HIV TESTING        Visit: M1, M3, M5, M9, M13,  M17, M21, M25, M29, M33, ED, 4WFU  [] REACTIVE or [] NON REACTIVE  If positive/reactive participant must be withdrawn from the study even if subsequent confirmatory testing is negative. If reactive participant referred for further testing and clinical management as per local standard of care  Is participant experiencing any signs or symptoms consistent with acute HIV infection  [] Yes [x] No   [x]  []  Chemistry     Visits: M1, M3, M4, M5, M6, M7,M8, M9, M11, M13, M17, M21, M25, M29, M33, ED, 4WFU, LTFU  [x]  []  Hematology  Visits: M1, M3, M4, M5, M6, M7, M8, M9, M11, M13, M17, M21, M25, M29, M33, ED, 4WFU, LTFU  []  [x]  Non rapid HIV immunoassay    Visits: M1, M3, M5, M9, M13, M17, M21, M25, M29, M33, ED, 4WFU LTFU  []  [x]  HIV-1 RNA     Visits: M1, M3, M5, M9, M13, M17, M21, M25, M29, M33, ED, 4WFU, LTFU  []  [x]  Neisseria gonorrhea (GC), Chlamydia trachomatis (CT), Trichomononas vaginalis (TV), syphilis.  If positive we will refer for treatment as per local guidelines.   Visit: M1, M3, M5, M9, M13, M17, M21, M25, M29, M33, ED, 4WFU, LTFU, PRN   **PRN if symptomatic at other visits**  [] GC/CT NAAT urine/vaginal swab  *Rectal and oropharyngeal swab can be collected based on reported sexual history. [] GC and CT: Rectal Swab [] Oropharyngeal Swab [] TV: vaginal Swab  *If male and menstruating may take sample at next visit  []  [x]  Coagulation Tests     Visit: M13, M33  []  [x]  Hepatitis B&C     Visit: M13, M25, ED  []  [x]  Fasting Labs: Glucose     Visit: M13 and M33  (Insulin, HbA1c only collect if withdrawal visit occurs at M13 or M33)  [x]  []  Plasma for Storage     Visits: M1, M2, M3,M4, M5, M6, M7,M8, M9, M11, M13, M17, M21, M25, M29, ED, LTFU Visits  []  [x]  Predose: PK Sampling  Collect < 1 hr before injection Visits: M1, M3, M5, M9, M13, M17, M21, M25, M29  []  [x]  Illicit Drug Screen     Visits: M5, M13, M21, M29, M33  []  [x]  Urinalysis: Morning specimen is preferred     Visits: M13,  M33, ED, 4WFU  Time of Collection:   LMP: No LMP for male patient.  []  N/A   Yes No/NA Lab Collection, Cont.:  []  [x]  Urine Pregnancy Test (POCBP only)  Visits: M1, M3, M5, M9, M13, M17, M21, M25, M29, M33 (resulted prior to receipt of IP), ED, 4WFU, LTFU   Time of Collection:     [] NA, assigned Male at birth or Not a POCBP  []                          [x]  Serum Pregnancy Test (POCBP only) only if urine test positive, perform a serum test to confirm.   Yes No/NA Study Drug Administration  []  [x]  CAB LA Administration:     Visit: M1        Needle Size: [] 1.5  [] 2 (For BMI >/= 30)   There is no height or weight on file to calculate BMI. Gauge: [] 21G [] 22G [] 23G  Site: [] Left Ventrogluteal  [] Right Ventrogluteal  Time of Administration:    []  [x]  CAB ULA Administration:   Visit: M3, M5, M9, M13, M17, M21, M25, M29  Sites should be rotated between right and left Needle Size: [] 1.5  [] 2 (For BMI >/= 30)   There is no height or weight on file to calculate BMI.  Gauge: [] 21G [] 22G [] 23G  Site: [] Left Ventrogluteal  [] Right Ventrogluteal  Time of Administration:    [x]  []  ISR Review completed, ISR photography necessary [x] Yes[] No     All Visits *Digital photographs will be documented at all visits (scheduled or unscheduled) on all participants who have an injection site reaction that is a visible, persistent Grade 2 (no improvement/resolution in >10 days), Grade >/=3, or serious. *Examination will include a physician/HCP assessment of pain (or tenderness), pruritus, warmth, infections, rash,  erythema (or redness), swelling (or induration), and nodules (granulomas or cysts) *For injection site nodules, the largest diameter will be recorded in the eCRF at each ISR assessment for nodules of at least Grade 1 severity  *Remote Visits: M6+2W, M7+2W, M8+2W, M10, M12, M15, M19, M23, M27, M31 should assess whether participants have any new ISR symptoms/signs since their last visit or have  previously reported ISR that has not improved or is worsening, if present site should perform an unscheduled visits ASAP for ISR assessment   Yes No/NA  Pharmacokinetic Sampling  [x]  []   Post Dose PK sampling: 2 hours after injection on dosing days(+/- 30 mins collection window Visits:1 week, M1, M1+1Wk, M2, M3, M3+1Wk, M4, M5, M5+1W, M6, M7, M8, M9, , M9+1Wk, M11, M13, M33, ED, 4WFU, LTFU     Time of PK Collection: 0905    Yes No Completed:  [x]  []   HIV/STI  Counseling/Condom Distribution:    All Visits, except remote calls [] Accepted  [x] Declined   Yes No/NA Completed  []  [x]  Was there an ISR during remote phone call visit:  [] Yes [x]  No If yes, schedule unscheduled ISR assessment ASAP Date:   [x]  []  Instructed to contact site if ISR gets worse or does not improve after 10 days from the last ISR assessment.  [x]  []  Assess for Protocol Deviations  [x]  []  Reminders:  Participants of childbearing potential agree to use a highly effective method of contraception consistently and correctly HIV vaccines are not permitted at any time during the study No other experimental agents, antiretroviral drugs, cytotoxic chemotherapy, or radiation therapy my not be administered throughout the trial No systemically administered immunomodulator's permitted Notify study staff if you experience any signs/symptoms, visit provider in ER or scheduled evaluation, or start any new OTC, supplemental or prescription therapies   Yes No Completed:  [x]  []  Compensation:  You will receive a payment of $78.00 at the end of each non dosing visit completed to reimburse you for your time and effort.  You will receive $125.00 at the end of each dosing visit completed to reimburse you for your time and effort.   If you must return to the clinic for an unscheduled visit, you will receive $78.   Amount: $78.00   [x]  []  Schedule Next Visit: Visits should be planned using a calendar months and scheduled based on the date  of the Day 1 injection.    Remote Call Visits schedule on M6+2 wk, M7+2wk, M8+2wk, M10, M12, M15, M19, M23, M27, M31 (Assess ISR, AES, Conmeds)  If transitioning to CAB 200mg /ml do not need LTFU visits, but do need ED visit and 4 week followup visit.  Date: 19AUG2025     Time: 0900   Version 2.2 14Jul2025 JLS

## 2023-12-01 NOTE — Research (Addendum)
 I hh I have reviewed this participants  labs and data for study.

## 2023-12-27 ENCOUNTER — Encounter

## 2023-12-29 ENCOUNTER — Other Ambulatory Visit: Payer: Self-pay

## 2023-12-29 ENCOUNTER — Encounter

## 2023-12-29 DIAGNOSIS — Z006 Encounter for examination for normal comparison and control in clinical research program: Secondary | ICD-10-CM

## 2023-12-29 MED ORDER — STUDY - EXTEND - CABOTEGRAVIR ULA (GSK1265744) 1600 MG IM INJECTION (PI-VAN DAM)
1600.0000 mg | INJECTION | INTRAMUSCULAR | Status: AC
  Administered 2023-12-29: 1600 mg via INTRAMUSCULAR
  Filled 2023-12-29: qty 3

## 2023-12-29 NOTE — Research (Cosign Needed Addendum)
 [name] [id number]                                                  Protocol Amendment 02  [facility]:  [phone]                                                                  TREATMENT PHASE  Visit: []  Day1+1W     []  M1    []  M1+1W    []  M2    []  M3    []  M3+1W    []  M4    [x] M5     [] M5+1W   [] M6   [] M6+2W   [] M7   [] M7+2W   [] M8   [] M8+2W   [] M9 [] M9+1W   [] M10   [] M11   [] M12   [] M13   [] M15    [] M17   [] M19   [] M21   [] M23   [] M25   [] M27   [] M29   [] M31   [] M33   [] ED  [] 4 wk followup (if not entering LTFU)   [] LTFU M1   [] LTFU M4   [] LTFU M8   [] LTFU M12     Date: 21AUG2025         4 Letter Code: MCEA Subject ID: [id number]  Assessments should occur in the following order:  eCSSRs  All other questionnaires  Triplicate 12-lead ECG  Vital signs  Blood draws (clinical laboratory tests, pre-dose pharmacokinetics, etc)  IP injection  Post-dose pharmacokinetics (if required)  Yes No Completed:  [x]  []  Verify participant identity before doing any study procedures   All Visits  [x]  []  Verify correct version of ICF is signed   All Visits   Yes No Questionnaires Completed: Patient Reported Outcome Assessment must be completed before other assessments take place at each designated visit. Completed electronically by participants.  [x]  []  eCSSRs    Visits: M1, M3, M5, M9, M13, M17, M21, M25, M29, M33, ED Any positive  (abnormal) response indicating SIB or any unusual changes in behavior, confirmed by the investigator will result in their discontinuation of study intervention, the PI/SI will arrange for urgent specialist psychiatric evaluation and management  []  [x]  Social Determinants of Health Questionnaire (SDoH)     Visits: M17, ED  [x]  []  Study Medication Satisfaction Questionnaire (SMSQs)     Visits: M1, M3, M5, M9, M13, M17, M21, M25, M29, M33, ED  []  [x]  PrEP Preference and Rationale Questionnaire (only if prior exposure to oral PrEP)     Visits: M9, M17, M25, M33, ED  [x]  []   Acceptability of Injection Questionnaire     Visits: M1, M3, M5, M9, M13, M17, M21, M25, M29, M33, ED  [x]  []  Generalized Anxiety Disorders (GAD-&) Questionnaire     Visits: M1, M3, M5, M9, M13, M17, M21, M25, M29, M33, ED  []  [x]  HIV-risk and PrEP use Anxiety and Stigma Questionnaire prior to the study drug administration     Visits: M17, M29, M33, ED  [x]  []  Sexual Health Practices Questionnaire-administer prior to study drug administration Visits: M1, M3, M5, M9, M13, M17, M21, M25, M29, M33, ED  Yes No Completed:  [x]  []  Review Changes in Medical History    All Visits  [x]  []  Review Changes in Concomitant Medications    All Visits  [x]  []  Assess for AEs, SAEs: All AEs (from Day 1 onwards) and SAEs (from Screening onwards) must be recorded in eCRF at all visits and reported within required timelines.    All Visits  Is participant experiencing any symptomatic STIs currently? [] Yes [x] No    [x]  []  Pull Review in: Medication Review Medication Review Medication Review Medication Indication Dose/Route/Frequency Start Date End Date  cetirizine  (Zyrtec )    Seasonal Allergies 10 mg tab PO QD 05Aug2023   Tylenol  Right ventrogluteal Injection Site tenderness 1000 mg PO once daily 20MAR2025 22MAR2025  Tylenol  Left Ventrogluteal Injection Site Tenderness 1000mg  PO once daily 26APR2025 26APR2025  Lotrimin AF Cream Right thigh Folliculitis 1 application daily  05SEP2025 18SEP2025  Triple Antibiotic Ointment Polymyxin B Sulfate/Bacitracin zinc, neomycin sulfate Right thigh Folliculitis 1 application daily 19SEP2025    AE Review Diagnosis/Symptoms Indicate: NSAE SAE AESI Maximum Grade Related/Not Related to study intervention CAB LA or CAB ULA Action/Dose Change Start Date (Time, if available) End Date (Time, if available)  Right ventrogluteal injection site tenderness NSAE  2 Related CAB LA none 20MAR2025 at 1000 22MAR2025 at 1200  Headache NSAE 1 Related CAB LA none 20MAR2025 at 1000  22MAR2025 at 1200  Left ventrogluteal injection site tenderness NSAE 2 Related to CAB LA none 23APR2025 at 2200 27APR2025 at 0900  Nausea NSAE 1 Related to CAB LA none 26APR2025 at 0900  27APR2025 at 0900  Right ventrogluteal  injection site tenderness NSAE 1 Related to ULA CAB  none 25Jun2025 at 1000 29Jun2025 at 1000  Left Ventrogluteal injection site tenderness NSAE 2 Related to CAB ULA none 22Aug2025 at 0900 25Aug2025 at 0900  Right Thigh Folliculitis NSAE 2 Not Related none 05Sep2025    Medical History Review  Diagnosis Grade Start Date End Date  Stomach Ulcers 2 2021 2021  Seasonal allergies 2 2008   Dry Skin 2 05Jul2011                    [x]  []  Contraception Review: Report in [facility] Visits: M1, M2, M3, M4, M5, M6, M7, M8, M9, M11, M13, M17, M21,M25, M29, M31, M33, ED, 4WFU, LTFU Visits Male of childbearing potential: Must agree to use a highly effective method of contraception consistently and correctly, must also continue to use adequate contraception methods for at least 52 weeks after the last injection. Male condoms must be used in addition to hormonal contraception Sexual Abstinence: considered a highly effective method only if defined as refraining from heterosexual intercourse during the entire period of risk associated with the study intervention.  The reliability of sexual abstinence needs to be evaluated in relation to the duration of the study and the preferred and usual lifestyle of the participant.   Yes No/NA Completed:  [x]  []  Premature cardiovascular disease: male participant <65 years or male participant <55 years in first degree relatives only.  Note changes since the start of the study.     Months 5, 13, 21, 29      [x] No Changes    [x]  []  Substance Usage: (list name of substance, amount used, start and stop dates)  Months 5, 79, 81, 69 Social History   Substance and Sexual Activity  Alcohol Use Not Currently   Comment: Started in 2017, does not normally  drink alcohol regularlly, last drink 17AUG2025  Social History   Substance and Sexual Activity  Drug Use No   Comment: Prior THC, Started 2014, stopped 01Feb2025    Tobacco Use: Low Risk  (12/30/2023)   Patient History    Smoking Tobacco Use: Never    Smokeless Tobacco Use: Never    Passive Exposure: Never    Caffeine Use: 2 caffeine containing beverages/day  [x]  []  Triplicate 12-lead ECG     Visit: M1, M3, M5, M9, M29 Perform in a semi-supine position after 5 minutes of rest.  3 individual ECG tracing should be obtained as closely as possible in succession, but no more than 2 minutes apart. The full set of triplicates should be completed in less than 6 minutes. If ECG abnormal clinically significant  then report as an AE/SAE.  Rest Start Time: 0855 ECG #1 start time: 0907    ECG #2: 0908    ECG #3: 0909   [x]  []  Vital Signs:  [x] Weight, BMI (after 5 mins in a semi-supine position)    Only on Visits: M1, M3, M5, M9, M13, M17, M21, M25, M29, M33, ED, 4W FU, LTFU Visits  [] Height    Visit: M9  [x] BP and Pulse (measured in a semi supine position after 5 minutes rest)     Visit: M1, M3, M5, M9, M13, M17, M21, M25, M29, M33, ED, 4WFU  [] Temperature and RR:    Visit: M13, ED  Rest Start Time: 9082  Vital Signs Start Time: 0923  Last Weight  Most recent update: 12/29/2023  9:11 AM    Weight  75.6 kg (166 lb 10.7 oz)             Body mass index is 20.83 kg/m.  Vitals:   12/29/23 0923  BP: 108/65  Pulse: 73     [x]  []  Brief Symptom Directed Physical Examination: Will include at a minimum, assessments of skin, lungs, CV, abdomen (liver and spleen). Any abnormalities must be noted in the CRF (e.g current medical conditions or AE logs).     Visit: M1, M3, M5, M6, M7, M8, M9, M11, M13, M17, M21, M25, M29, M33, ED, 4W FU, LTFU   Physical Exam Constitutional:      Appearance: Normal appearance. He is normal weight.  Cardiovascular:     Rate and Rhythm: Normal rate and regular  rhythm.     Heart sounds: Normal heart sounds.  Pulmonary:     Effort: Pulmonary effort is normal. No respiratory distress.     Breath sounds: Normal breath sounds.  Abdominal:     General: Bowel sounds are normal. There is no distension.     Tenderness: There is no abdominal tenderness.  Musculoskeletal:        General: Normal range of motion.  Skin:    General: Skin is warm and dry.  Neurological:     General: No focal deficit present.     Mental Status: He is alert and oriented to person, place, and time.  Psychiatric:        Mood and Affect: Mood normal.        Behavior: Behavior normal.        Thought Content: Thought content normal.        Judgment: Judgment normal.       Yes No Blood Draw Tracking:  All Visits  [x]  []  Verified previous Hgb to be >= 10 (if value <10, adjust volume per unit SOP)                          []   NA, healthy volunteer, previous Hgb results are unavailable  [x]  []  Does participant report non-study blood collection in the previous 2 months?  [] Yes, estimated vol. mL    [x]  No  []  [x]  Assess Fasting Status.   Visits: M13, M33                                                      Is participant Fasting for at least 6 hours, overnight fast preferred?  [x]  No   []  Yes, Date/Time of last meal?   [x]  []  Blood Drawn.     Problems?  [x]  No   [] Yes, why?   Time: 0930    Total Vol: 36 mL   Yes No Lab Collection:  [x]  []  RAPID HIV TESTING        Visit: M1, M3, M5, M9, M13, M17, M21, M25, M29, M33, ED, 4WFU  [] REACTIVE or [x] NON REACTIVE  If positive/reactive participant must be withdrawn from the study even if subsequent confirmatory testing is negative. If reactive participant referred for further testing and clinical management as per local standard of care  Is participant experiencing any signs or symptoms consistent with acute HIV infection  [] Yes [x] No   [x]  []  Chemistry     Visits: M1, M3, M4, M5, M6, M7,M8, M9, M11, M13, M17, M21, M25, M29, M33,  ED, 4WFU, LTFU  [x]  []  Hematology     Visits: M1, M3, M4, M5, M6, M7, M8, M9, M11, M13, M17, M21, M25, M29, M33, ED, 4WFU, LTFU  [x]  []  Non rapid HIV immunoassay    Visits: M1, M3, M5, M9, M13, M17, M21, M25, M29, M33, ED, 4WFU LTFU  [x]  []  HIV-1 RNA     Visits: M1, M3, M5, M9, M13, M17, M21, M25, M29, M33, ED, 4WFU, LTFU  [x]  []  Neisseria gonorrhea (GC), Chlamydia trachomatis (CT), Trichomononas vaginalis (TV), syphilis.  If positive we will refer for treatment as per local guidelines.   Visit: M1, M3, M5, M9, M13, M17, M21, M25, M29, M33, ED, 4WFU, LTFU, PRN   **PRN if symptomatic at other visits**  [x] GC/CT NAAT urine/vaginal swab  *Rectal and oropharyngeal swab can be collected based on reported sexual history. [x] GC and CT: Rectal Swab [x] Oropharyngeal Swab [] TV: vaginal Swab  *If male and menstruating may take sample at next visit  []  [x]  Coagulation Tests     Visit: M13, M33  []  [x]  Hepatitis B&C     Visit: M13, M25, ED  []  [x]  Fasting Labs: Glucose     Visit: M13 and M33  (Insulin, HbA1c only collect if withdrawal visit occurs at M13 or M33)  [x]  []  Plasma for Storage     Visits: M1, M2, M3,M4, M5, M6, M7,M8, M9, M11, M13, M17, M21, M25, M29, ED, LTFU Visits  [x]  []  Predose: PK Sampling  Collect < 1 hr before injection Visits: M1, M3, M5, M9, M13, M17, M21, M25, M29  [x]  []  Illicit Drug Screen     Visits: M5, M13, M21, M29, M33  []  [x]  Urinalysis: Morning specimen is preferred     Visits: M13, M33, ED, 4WFU  Time of Collection:   LMP: No LMP for male patient.  []  N/A   Yes No/NA Lab Collection, Cont.:  [x]  []  Urine Pregnancy Test (POCBP only)  Visits: M1, M3, M5, M9, M13, M17, M21, M25, M29,  M33 (resulted prior to receipt of IP), ED, 4WFU, LTFU   Time of Collection:     [x] NA, assigned Male at birth or Not a POCBP  []                          [x]  Serum Pregnancy Test (POCBP only) only if urine test positive, perform a serum test to confirm.   Yes No/NA Study Drug  Administration  []  [x]  CAB LA Administration:     Visit: M1        Needle Size: [] 1.5  [] 2 (For BMI >/= 30)   Body mass index is 20.83 kg/m. Gauge: [] 21G [] 22G [] 23G  Site: [] Left Ventrogluteal  [] Right Ventrogluteal  Time of Administration:    [x]  []  CAB ULA Administration:   Visit: M3, M5, M9, M13, M17, M21, M25, M29  Sites should be rotated between right and left Needle Size: [x] 1.5  [] 2 (For BMI >/= 30)   Body mass index is 20.83 kg/m.  Gauge: [x] 21G [] 22G [] 23G  Site: [x] Left Ventrogluteal  [] Right Ventrogluteal  Time of Administration: 0938   [x]  []  ISR Review completed, ISR photography necessary [] Yes[x] No     All Visits *Digital photographs will be documented at all visits (scheduled or unscheduled) on all participants who have an injection site reaction that is a visible, persistent Grade 2 (no improvement/resolution in >10 days), Grade >/=3, or serious. *Examination will include a physician/HCP assessment of pain (or tenderness), pruritus, warmth, infections, rash, erythema (or redness), swelling (or induration), and nodules (granulomas or cysts) *For injection site nodules, the largest diameter will be recorded in the eCRF at each ISR assessment for nodules of at least Grade 1 severity  *Remote Visits: M6+2W, M7+2W, M8+2W, M10, M12, M15, M19, M23, M27, M31 should assess whether participants have any new ISR symptoms/signs since their last visit or have previously reported ISR that has not improved or is worsening, if present site should perform an unscheduled visits ASAP for ISR assessment   Yes No/NA  Pharmacokinetic Sampling  [x]  []   Post Dose PK sampling: 2 hours after injection on dosing days(+/- 30 mins collection window Visits:1 week, M1, M1+1Wk, M2, M3, M3+1Wk, M4, M5, M5+1W, M6, M7, M8, M9, , M9+1Wk, M11, M13, M33, ED, 4WFU, LTFU   Time of Injection: 0938  Time of PK Collection: 1127    Yes No Completed:  [x]  []   HIV/STI  Counseling/Condom Distribution:     All Visits, except remote calls [] Accepted  [x] Declined   Yes No/NA Completed  []  [x]  Was there an ISR during remote phone call visit:  [] Yes [x]  No If yes, schedule unscheduled ISR assessment ASAP Date:   [x]  []  Instructed to contact site if ISR gets worse or does not improve after 10 days from the last ISR assessment.  [x]  []  Assess for Protocol Deviations  [x]  []  Reminders:  Participants of childbearing potential agree to use a highly effective method of contraception consistently and correctly HIV vaccines are not permitted at any time during the study No other experimental agents, antiretroviral drugs, cytotoxic chemotherapy, or radiation therapy my not be administered throughout the trial No systemically administered immunomodulator's permitted Notify study staff if you experience any signs/symptoms, visit provider in ER or scheduled evaluation, or start any new OTC, supplemental or prescription therapies   Yes No Completed:  [x]  []  Compensation:  You will receive a payment of $78.00 at the end of each non dosing visit completed to reimburse you for  your time and effort.  You will receive $125.00 at the end of each dosing visit completed to reimburse you for your time and effort.   If you must return to the clinic for an unscheduled visit, you will receive $78.   Amount: $125.00   [x]  []  Schedule Next Visit: Visits should be planned using a calendar months and scheduled based on the date of the Day 1 injection.    Remote Call Visits schedule on M6+2 wk, M7+2wk, M8+2wk, M10, M12, M15, M19, M23, M27, M31 (Assess ISR, AES, Conmeds)  If transitioning to CAB 200mg /ml do not need LTFU visits, but do need ED visit and 4 week followup visit.  Date: 28AUG2025     Time: 1100   Version 2.2 14Jul2025 JLS

## 2024-01-05 ENCOUNTER — Encounter

## 2024-01-05 ENCOUNTER — Other Ambulatory Visit: Payer: Self-pay

## 2024-01-05 DIAGNOSIS — Z006 Encounter for examination for normal comparison and control in clinical research program: Secondary | ICD-10-CM

## 2024-01-05 NOTE — Research (Addendum)
 Johnny Nelson [id number]                                                  Protocol Amendment 02  [facility]:  [phone]                                                                  TREATMENT PHASE  Visit: []  Day1+1W     []  M1    []  M1+1W    []  M2    []  M3    []  M3+1W    []  M4    [] M5     [x] M5+1W   [] M6   [] M6+2W   [] M7   [] M7+2W   [] M8   [] M8+2W   [] M9 [] M9+1W   [] M10   [] M11   [] M12   [] M13   [] M15    [] M17   [] M19   [] M21   [] M23   [] M25   [] M27   [] M29   [] M31   [] M33   [] ED  [] 4 wk followup (if not entering LTFU)   [] LTFU M1   [] LTFU M4   [] LTFU M8   [] LTFU M12     Date: 28Aug2025         4 Letter Code: MCEA Subject ID: [id number]  Assessments should occur in the following order:  eCSSRs  All other questionnaires  Triplicate 12-lead ECG  Vital signs  Blood draws (clinical laboratory tests, pre-dose pharmacokinetics, etc)  IP injection  Post-dose pharmacokinetics (if required)  Yes No Completed:  [x]  []  Verify participant identity before doing any study procedures   All Visits  [x]  []  Verify correct version of ICF is signed   All Visits   Yes No Questionnaires Completed: Patient Reported Outcome Assessment must be completed before other assessments take place at each designated visit. Completed electronically by participants.  []  [x]  eCSSRs    Visits: M1, M3, M5, M9, M13, M17, M21, M25, M29, M33, ED Any positive  (abnormal) response indicating SIB or any unusual changes in behavior, confirmed by the investigator will result in their discontinuation of study intervention, the PI/SI will arrange for urgent specialist psychiatric evaluation and management  []  [x]  Social Determinants of Health Questionnaire (SDoH)     Visits: M17, ED  []  [x]  Study Medication Satisfaction Questionnaire (SMSQs)     Visits: M1, M3, M5, M9, M13, M17, M21, M25, M29, M33, ED  []  [x]  PrEP Preference and Rationale Questionnaire (only if prior exposure to oral PrEP)     Visits: M9, M17, M25, M33, ED  []  [x]   Acceptability of Injection Questionnaire     Visits: M1, M3, M5, M9, M13, M17, M21, M25, M29, M33, ED  []  [x]  Generalized Anxiety Disorders (GAD-&) Questionnaire     Visits: M1, M3, M5, M9, M13, M17, M21, M25, M29, M33, ED  []  [x]  HIV-risk and PrEP use Anxiety and Stigma Questionnaire prior to the study drug administration     Visits: M17, M29, M33, ED  []  [x]  Sexual Health Practices Questionnaire-administer prior to study drug administration Visits: M1, M3, M5, M9, M13, M17, M21, M25, M29, M33, ED  Yes No Completed:  [x]  []  Review Changes in Medical History    All Visits  [x]  []  Review Changes in Concomitant Medications    All Visits  [x]  []  Assess for AEs, SAEs: All AEs (from Day 1 onwards) and SAEs (from Screening onwards) must be recorded in eCRF at all visits and reported within required timelines.    All Visits  Is participant experiencing any symptomatic STIs currently? [] Yes [x] No    [x]  []  Pull Review in: Medication Review Medication Review Medication Indication Dose/Route/Frequency Start Date End Date  cetirizine  (Zyrtec )    Seasonal Allergies 10 mg tab PO QD 05Aug2023   Tylenol  Right ventrogluteal Injection Site tenderness 1000 mg PO once daily 20MAR2025 22MAR2025  Tylenol  Left Ventrogluteal Injection Site Tenderness 1000mg  PO once daily 26APR2025 26APR2025         AE Review Diagnosis/Symptoms Indicate: NSAE SAE AESI Maximum Grade Related/Not Related to study intervention CAB LA or CAB ULA Action/Dose Change Start Date (Time, if available) End Date (Time, if available)  Right ventrogluteal injection site tenderness NSAE  2 Related CAB LA none 20MAR2025 at 1000 22MAR2025 at 1200  Headache NSAE 1 Related CAB LA none 20MAR2025 at 1000 22MAR2025 at 1200  Left ventrogluteal injection site tenderness NSAE 2 Related to CAB LA none 23APR2025 at 2200 27APR2025 at 0900  Nausea NSAE 1 Related to CAB LA none 26APR2025 at 0900  27APR2025 at 0900  ISR (R) ventrogluteal tenderness  NSAE 1 Related to ULA CAB  none 25Jun2025 at 1000 29Jun2025 at 1000  Left Ventrogluteal injection site tenderness NSAE 2 Related to CAB ULA none 22Aug2025 at 0900 25Aug2025 at 0900   Medical History Review  Diagnosis Grade Start Date End Date  Stomach Ulcers 2 2021 2021  Seasonal allergies 2 2008   Dry Skin 2 05Jul2011               []  [x]  Contraception Review: Report in [facility] Visits: M1, M2, M3, M4, M5, M6, M7, M8, M9, M11, M13, M17, M21,M25, M29, M31, M33, ED, 4WFU, LTFU Visits Male of childbearing potential: Must agree to use a highly effective method of contraception consistently and correctly, must also continue to use adequate contraception methods for at least 52 weeks after the last injection. Male condoms must be used in addition to hormonal contraception Sexual Abstinence: considered a highly effective method only if defined as refraining from heterosexual intercourse during the entire period of risk associated with the study intervention.  The reliability of sexual abstinence needs to be evaluated in relation to the duration of the study and the preferred and usual lifestyle of the participant.   Yes No/NA Completed:  []  [x]  Premature cardiovascular disease: male participant <65 years or male participant <55 years in first degree relatives only.  Note changes since the start of the study.     Months 5, 13, 21, 29      [] No Changes    []  [x]  Substance Usage: (list name of substance, amount used, start and stop dates)  Months 5, 40, 72, 71 Social History   Substance and Sexual Activity  Alcohol Use Not Currently   Comment: Started in 2017, does not normally drink alcohol regularlly, last drink 17AUG2025    Social History   Substance and Sexual Activity  Drug Use No   Comment: Prior THC, Started 2014, stopped 01Feb2025    Tobacco Use: Low Risk  (12/30/2023)   Patient History    Smoking Tobacco Use: Never    Smokeless Tobacco  Use: Never    Passive Exposure: Never     Caffeine Use:  caffeine containing beverages/day  []  [x]  Triplicate 12-lead ECG     Visit: M1, M3, M5, M9, M29 Perform in a semi-supine position after 5 minutes of rest.  3 individual ECG tracing should be obtained as closely as possible in succession, but no more than 2 minutes apart. The full set of triplicates should be completed in less than 6 minutes. If ECG abnormal clinically significant  then report as an AE/SAE.  Rest Start Time:  ECG #1 start time:     ECG #2:     ECG #3:    []  [x]  Vital Signs:  [] Weight, BMI (after 5 mins in a semi-supine position)    Only on Visits: M1, M3, M5, M9, M13, M17, M21, M25, M29, M33, ED, 4W FU, LTFU Visits  [] Height    Visit: M9  [] BP and Pulse (measured in a semi supine position after 5 minutes rest)     Visit: M1, M3, M5, M9, M13, M17, M21, M25, M29, M33, ED, 4WFU  [] Temperature and RR:    Visit: M13, ED  Rest Start Time:   Vital Signs Start Time:     There is no height or weight on file to calculate BMI.  There were no vitals filed for this visit.   []  [x]  Brief Symptom Directed Physical Examination: Will include at a minimum, assessments of skin, lungs, CV, abdomen (liver and spleen). Any abnormalities must be noted in the CRF (e.g current medical conditions or AE logs).     Visit: M1, M3, M5, M6, M7, M8, M9, M11, M13, M17, M21, M25, M29, M33, ED, 4W FU, LTFU      Yes No Blood Draw Tracking:  All Visits  [x]  []  Verified previous Hgb to be >= 10 (if value <10, adjust volume per unit SOP)                          [] NA, healthy volunteer, previous Hgb results are unavailable  [x]  []  Does participant report non-study blood collection in the previous 2 months?  [] Yes, estimated vol. mL    [x]  No  [x]  []  Assess Fasting Status.   Visits: M13, M33                                                      Is participant Fasting for at least 6 hours, overnight fast preferred?  [x]  No   []  Yes, Date/Time of last meal?   [x]  []  Blood Drawn.     Problems?   [x]  No   [] Yes, why?   Time: 0938    Total Vol: 2 mL   Yes No Lab Collection:  []  [x]  RAPID HIV TESTING        Visit: M1, M3, M5, M9, M13, M17, M21, M25, M29, M33, ED, 4WFU  [] REACTIVE or [] NON REACTIVE  If positive/reactive participant must be withdrawn from the study even if subsequent confirmatory testing is negative. If reactive participant referred for further testing and clinical management as per local standard of care  Is participant experiencing any signs or symptoms consistent with acute HIV infection  [] Yes [] No   []  [x]  Chemistry     Visits: M1, M3, M4, M5, M6, M7,M8, M9, M11, M13,  M17, M21, M25, M29, M33, ED, 4WFU, LTFU  []  [x]  Hematology     Visits: M1, M3, M4, M5, M6, M7, M8, M9, M11, M13, M17, M21, M25, M29, M33, ED, 4WFU, LTFU  []  [x]  Non rapid HIV immunoassay    Visits: M1, M3, M5, M9, M13, M17, M21, M25, M29, M33, ED, 4WFU LTFU  []  [x]  HIV-1 RNA     Visits: M1, M3, M5, M9, M13, M17, M21, M25, M29, M33, ED, 4WFU, LTFU  []  [x]  Neisseria gonorrhea (GC), Chlamydia trachomatis (CT), Trichomononas vaginalis (TV), syphilis.  If positive we will refer for treatment as per local guidelines.   Visit: M1, M3, M5, M9, M13, M17, M21, M25, M29, M33, ED, 4WFU, LTFU, PRN   **PRN if symptomatic at other visits**  [] GC/CT NAAT urine/vaginal swab  *Rectal and oropharyngeal swab can be collected based on reported sexual history. [] GC and CT: Rectal Swab [] Oropharyngeal Swab [] TV: vaginal Swab  *If male and menstruating may take sample at next visit  []  [x]  Coagulation Tests     Visit: M13, M33  []  [x]  Hepatitis B&C     Visit: M13, M25, ED  []  [x]  Fasting Labs: Glucose     Visit: M13 and M33  (Insulin, HbA1c only collect if withdrawal visit occurs at M13 or M33)  []  [x]  Plasma for Storage     Visits: M1, M2, M3,M4, M5, M6, M7,M8, M9, M11, M13, M17, M21, M25, M29, ED, LTFU Visits  []  [x]  Predose: PK Sampling  Collect < 1 hr before injection Visits: M1, M3, M5, M9, M13, M17, M21,  M25, M29  []  [x]  Illicit Drug Screen     Visits: M5, M13, M21, M29, M33  []  [x]  Urinalysis: Morning specimen is preferred     Visits: M13, M33, ED, 4WFU  Time of Collection:   LMP: No LMP for male patient.  []  N/A   Yes No/NA Lab Collection, Cont.:  []  [x]  Urine Pregnancy Test (POCBP only)  Visits: M1, M3, M5, M9, M13, M17, M21, M25, M29, M33 (resulted prior to receipt of IP), ED, 4WFU, LTFU   Time of Collection:     [] NA, assigned Male at birth or Not a POCBP  []                          [x]  Serum Pregnancy Test (POCBP only) only if urine test positive, perform a serum test to confirm.   Yes No/NA Study Drug Administration  []  [x]  CAB LA Administration:     Visit: M1        Needle Size: [] 1.5  [] 2 (For BMI >/= 30)   There is no height or weight on file to calculate BMI. Gauge: [] 21G [] 22G [] 23G  Site: [] Left Ventrogluteal  [] Right Ventrogluteal  Time of Administration:    []  [x]  CAB ULA Administration:   Visit: M3, M5, M9, M13, M17, M21, M25, M29  Sites should be rotated between right and left Needle Size: [] 1.5  [] 2 (For BMI >/= 30)   There is no height or weight on file to calculate BMI.  Gauge: [] 21G [] 22G [] 23G  Site: [] Left Ventrogluteal  [] Right Ventrogluteal  Time of Administration:    [x]  []  ISR Review completed, ISR photography necessary [] Yes[x] No     All Visits *Digital photographs will be documented at all visits (scheduled or unscheduled) on all participants who have an injection site reaction that is a visible, persistent Grade 2 (no improvement/resolution in >10 days), Grade >/=3,  or serious. *Examination will include a physician/HCP assessment of pain (or tenderness), pruritus, warmth, infections, rash, erythema (or redness), swelling (or induration), and nodules (granulomas or cysts) *For injection site nodules, the largest diameter will be recorded in the eCRF at each ISR assessment for nodules of at least Grade 1 severity  *Remote Visits: M6+2W, M7+2W, M8+2W,  M10, M12, M15, M19, M23, M27, M31 should assess whether participants have any new ISR symptoms/signs since their last visit or have previously reported ISR that has not improved or is worsening, if present site should perform an unscheduled visits ASAP for ISR assessment   Yes No/NA  Pharmacokinetic Sampling  [x]  []   Post Dose PK sampling: 2 hours after injection on dosing days(+/- 30 mins collection window Visits:1 week, M1, M1+1Wk, M2, M3, M3+1Wk, M4, M5, M5+1W, M6, M7, M8, M9, , M9+1Wk, M11, M13, M33, ED, 4WFU, LTFU   Time of Injection: 21Aug2025 0938  Time of PK Collection: 0938    Yes No Completed:  [x]  []   HIV/STI  Counseling/Condom Distribution:    All Visits, except remote calls [] Accepted  [x] Declined   Yes No/NA Completed  []  [x]  Was there an ISR during remote phone call visit:  [] Yes []  No If yes, schedule unscheduled ISR assessment ASAP Date:   [x]  []  Instructed to contact site if ISR gets worse or does not improve after 10 days from the last ISR assessment.  [x]  []  Assess for Protocol Deviations  [x]  []  Reminders:  Participants of childbearing potential agree to use a highly effective method of contraception consistently and correctly HIV vaccines are not permitted at any time during the study No other experimental agents, antiretroviral drugs, cytotoxic chemotherapy, or radiation therapy my not be administered throughout the trial No systemically administered immunomodulator's permitted Notify study staff if you experience any signs/symptoms, visit provider in ER or scheduled evaluation, or start any new OTC, supplemental or prescription therapies   Yes No Completed:  [x]  []  Compensation:  You will receive a payment of $78.00 at the end of each non dosing visit completed to reimburse you for your time and effort.  You will receive $125.00 at the end of each dosing visit completed to reimburse you for your time and effort.   If you must return to the clinic for an  unscheduled visit, you will receive $78.   Amount: $78   [x]  []  Schedule Next Visit: Visits should be planned using a calendar months and scheduled based on the date of the Day 1 injection.    Remote Call Visits schedule on M6+2 wk, M7+2wk, M8+2wk, M10, M12, M15, M19, M23, M27, M31 (Assess ISR, AES, Conmeds)  If transitioning to CAB 200mg /ml do not need LTFU visits, but do need ED visit and 4 week followup visit.  Date: 18Sep2025     Time: 1000   Version 2.2 14Jul2025 JLS

## 2024-01-13 NOTE — Progress Notes (Signed)
 I have reviewed this participant's findings from visit, labs for this EXTEND study visit.

## 2024-01-13 NOTE — Research (Signed)
 SABRA

## 2024-01-13 NOTE — Progress Notes (Signed)
 I have reviewed this participant's visit findings and labs for EXTEND study.

## 2024-01-19 ENCOUNTER — Encounter: Payer: Self-pay | Admitting: Family Medicine

## 2024-01-26 ENCOUNTER — Encounter

## 2024-01-27 ENCOUNTER — Other Ambulatory Visit: Payer: Self-pay

## 2024-01-27 ENCOUNTER — Encounter: Admitting: *Deleted

## 2024-01-27 DIAGNOSIS — Z006 Encounter for examination for normal comparison and control in clinical research program: Secondary | ICD-10-CM

## 2024-01-27 NOTE — Research (Cosign Needed Addendum)
 [name] [id number]                                                  Protocol Amendment 02  [facility]:  [phone]                                                                  TREATMENT PHASE  Visit: []  Day1+1W     []  M1    []  M1+1W    []  M2    []  M3    []  M3+1W    []  M4    [] M5     [] M5+1W   [x] M6   [] M6+2W   [] M7   [] M7+2W   [] M8   [] M8+2W   [] M9 [] M9+1W   [] M10   [] M11   [] M12   [] M13   [] M15    [] M17   [] M19   [] M21   [] M23   [] M25   [] M27   [] M29   [] M31   [] M33   [] ED  [] 4 wk followup (if not entering LTFU)   [] LTFU M1   [] LTFU M4   [] LTFU M8   [] LTFU M12     Date: 19Sep2025         4 Letter Code: MCEA Subject ID: [id number]  Assessments should occur in the following order:  eCSSRs  All other questionnaires  Triplicate 12-lead ECG  Vital signs  Blood draws (clinical laboratory tests, pre-dose pharmacokinetics, etc)  IP injection  Post-dose pharmacokinetics (if required)  Yes No Completed:  [x]  []  Verify participant identity before doing any study procedures   All Visits  [x]  []  Verify correct version of ICF is signed   All Visits   Yes No Questionnaires Completed: Patient Reported Outcome Assessment must be completed before other assessments take place at each designated visit. Completed electronically by participants.  []  [x]  eCSSRs    Visits: M1, M3, M5, M9, M13, M17, M21, M25, M29, M33, ED Any positive  (abnormal) response indicating SIB or any unusual changes in behavior, confirmed by the investigator will result in their discontinuation of study intervention, the PI/SI will arrange for urgent specialist psychiatric evaluation and management  []  [x]  Social Determinants of Health Questionnaire (SDoH)     Visits: M17, ED  []  [x]  Study Medication Satisfaction Questionnaire (SMSQs)     Visits: M1, M3, M5, M9, M13, M17, M21, M25, M29, M33, ED  []  [x]  PrEP Preference and Rationale Questionnaire (only if prior exposure to oral PrEP)     Visits: M9, M17, M25, M33, ED  []  [x]   Acceptability of Injection Questionnaire     Visits: M1, M3, M5, M9, M13, M17, M21, M25, M29, M33, ED  []  [x]  Generalized Anxiety Disorders (GAD-&) Questionnaire     Visits: M1, M3, M5, M9, M13, M17, M21, M25, M29, M33, ED  []  [x]  HIV-risk and PrEP use Anxiety and Stigma Questionnaire prior to the study drug administration     Visits: M17, M29, M33, ED  []  [x]  Sexual Health Practices Questionnaire-administer prior to study drug administration Visits: M1, M3, M5, M9, M13, M17, M21, M25, M29, M33, ED  Yes No Completed:  [x]  []  Review Changes in Medical History    All Visits  [x]  []  Review Changes in Concomitant Medications    All Visits  [x]  []  Assess for AEs, SAEs: All AEs (from Day 1 onwards) and SAEs (from Screening onwards) must be recorded in eCRF at all visits and reported within required timelines.    All Visits  Is participant experiencing any symptomatic STIs currently? [] Yes [x] No    [x]  []  Pull Review in: Medication Review Medication Review Medication Indication Dose/Route/Frequency Start Date End Date  cetirizine  (Zyrtec )    Seasonal Allergies 10 mg tab PO QD 05Aug2023   Tylenol  Right ventrogluteal Injection Site tenderness 1000 mg PO once daily 20MAR2025 22MAR2025  Tylenol  Left Ventrogluteal Injection Site Tenderness 1000mg  PO once daily 26APR2025 26APR2025  Lotrimin AF Cream Small Bump (approx. 0.42mm) Right Thigh, Furuncle 1 application daily  05SEP2025 18SEP2025  Triple Antibiotic Ointment Polymyxin B Sulfate/Bacitracin zinc, neomycin sulfate Small Bump (approx. 0.74mm) Right Thigh, Furuncle 1 application daily 19SEP2025    AE Review Diagnosis/Symptoms Indicate: NSAE SAE AESI Maximum Grade Related/Not Related to study intervention CAB LA or CAB ULA Action/Dose Change Start Date (Time, if available) End Date (Time, if available)  Right ventrogluteal injection site tenderness NSAE  2 Related CAB LA none 20MAR2025 at 1000 22MAR2025 at 1200  Headache NSAE 1 Related CAB LA  none 20MAR2025 at 1000 22MAR2025 at 1200  Left ventrogluteal injection site tenderness NSAE 2 Related to CAB LA none 23APR2025 at 2200 27APR2025 at 0900  Nausea NSAE 1 Related to CAB LA none 26APR2025 at 0900  27APR2025 at 0900  Right ventrogluteal  injection site tenderness NSAE 1 Related to ULA CAB  none 25Jun2025 at 1000 29Jun2025 at 1000  Left Ventrogluteal injection site tenderness NSAE 2 Related to CAB ULA none 22Aug2025 at 0900 25Aug2025 at 0900  Small Bump (approx. 0.31mm) Right Thigh, Furuncle NSAE 2 Not Related none 05Sep2025    Medical History Review  Diagnosis Grade Start Date End Date  Stomach Ulcers 2 2021 2021  Seasonal allergies 2 2008   Dry Skin 2 05Jul2011                    [x]  []  Contraception Review: Report in [facility] Visits: M1, M2, M3, M4, M5, M6, M7, M8, M9, M11, M13, M17, M21,M25, M29, M31, M33, ED, 4WFU, LTFU Visits Male of childbearing potential: Must agree to use a highly effective method of contraception consistently and correctly, must also continue to use adequate contraception methods for at least 52 weeks after the last injection. Male condoms must be used in addition to hormonal contraception Sexual Abstinence: considered a highly effective method only if defined as refraining from heterosexual intercourse during the entire period of risk associated with the study intervention.  The reliability of sexual abstinence needs to be evaluated in relation to the duration of the study and the preferred and usual lifestyle of the participant.   Yes No/NA Completed:  []  [x]  Premature cardiovascular disease: male participant <65 years or male participant <55 years in first degree relatives only.  Note changes since the start of the study.     Months 5, 13, 21, 29      [] No Changes    []  [x]  Substance Usage: (list name of substance, amount used, start and stop dates)  Months 5, 60, 73, 83 Social History   Substance and Sexual Activity  Alcohol Use Not Currently    Comment: Started in 2017, does not  normally drink alcohol regularlly, last drink 17AUG2025    Social History   Substance and Sexual Activity  Drug Use No   Comment: Prior THC, Started 2014, stopped 01Feb2025    Tobacco Use: Low Risk  (12/30/2023)   Patient History    Smoking Tobacco Use: Never    Smokeless Tobacco Use: Never    Passive Exposure: Never    Caffeine Use:  caffeine containing beverages/day  []  [x]  Triplicate 12-lead ECG     Visit: M1, M3, M5, M9, M29 Perform in a semi-supine position after 5 minutes of rest.  3 individual ECG tracing should be obtained as closely as possible in succession, but no more than 2 minutes apart. The full set of triplicates should be completed in less than 6 minutes. If ECG abnormal clinically significant  then report as an AE/SAE.  Rest Start Time:  ECG #1 start time:     ECG #2:     ECG #3:    []  [x]  Vital Signs:  [] Weight, BMI (after 5 mins in a semi-supine position)    Only on Visits: M1, M3, M5, M9, M13, M17, M21, M25, M29, M33, ED, 4W FU, LTFU Visits  [] Height    Visit: M9  [] BP and Pulse (measured in a semi supine position after 5 minutes rest)     Visit: M1, M3, M5, M9, M13, M17, M21, M25, M29, M33, ED, 4WFU  [] Temperature and RR:    Visit: M13, ED  Rest Start Time:   Vital Signs Start Time:     There is no height or weight on file to calculate BMI.  There were no vitals filed for this visit.   [x]  []  Brief Symptom Directed Physical Examination: Will include at a minimum, assessments of skin, lungs, CV, abdomen (liver and spleen). Any abnormalities must be noted in the CRF (e.g current medical conditions or AE logs).     Visit: M1, M3, M5, M6, M7, M8, M9, M11, M13, M17, M21, M25, M29, M33, ED, 4W FU, LTFU   Physical Exam Constitutional:      Appearance: Normal appearance.  Cardiovascular:     Rate and Rhythm: Normal rate and regular rhythm.  Pulmonary:     Effort: Pulmonary effort is normal.     Breath sounds: Normal  breath sounds.  Abdominal:     General: Abdomen is flat. Bowel sounds are normal.     Palpations: Abdomen is soft.  Skin:    General: Skin is warm.     Comments: Patient states small bump to (R) thigh since 05Sep2025. States it's about size of small bead (0.31mm). Denies any drainage or redness. States mild tenderness to touch. Has been using lotrimin AF to area. Denied any itching to area. Enc to try triple antibiotic ointment to area and to stop Lotrimin AF. Plans to pick up some today and start. I did not visualized area but told the participant that if it did not resolve or he developed redness, drainage or increase tenderness or pain to go to the UC for evaluation.   Neurological:     Mental Status: He is alert.  Psychiatric:        Mood and Affect: Mood normal.        Yes No Blood Draw Tracking:  All Visits  [x]  []  Verified previous Hgb to be >= 10 (if value <10, adjust volume per unit SOP)                          []   NA, healthy volunteer, previous Hgb results are unavailable  [x]  []  Does participant report non-study blood collection in the previous 2 months?  [] Yes, estimated vol. mL    [x]  No  [x]  []  Assess Fasting Status.   Visits: M13, M33                                                      Is participant Fasting for at least 6 hours, overnight fast preferred?  [x]  No   []  Yes, Date/Time of last meal?   [x]  []  Blood Drawn.     Problems?  [x]  No   [] Yes, why?   Time: 1030    Total Vol: 14 mL   Yes No Lab Collection:  []  [x]  RAPID HIV TESTING        Visit: M1, M3, M5, M9, M13, M17, M21, M25, M29, M33, ED, 4WFU  [] REACTIVE or [] NON REACTIVE  If positive/reactive participant must be withdrawn from the study even if subsequent confirmatory testing is negative. If reactive participant referred for further testing and clinical management as per local standard of care  Is participant experiencing any signs or symptoms consistent with acute HIV infection  [] Yes [] No   [x]  []   Chemistry     Visits: M1, M3, M4, M5, M6, M7,M8, M9, M11, M13, M17, M21, M25, M29, M33, ED, 4WFU, LTFU  [x]  []  Hematology     Visits: M1, M3, M4, M5, M6, M7, M8, M9, M11, M13, M17, M21, M25, M29, M33, ED, 4WFU, LTFU  []  [x]  Non rapid HIV immunoassay    Visits: M1, M3, M5, M9, M13, M17, M21, M25, M29, M33, ED, 4WFU LTFU  []  [x]  HIV-1 RNA     Visits: M1, M3, M5, M9, M13, M17, M21, M25, M29, M33, ED, 4WFU, LTFU  []  [x]  Neisseria gonorrhea (GC), Chlamydia trachomatis (CT), Trichomononas vaginalis (TV), syphilis.  If positive we will refer for treatment as per local guidelines.   Visit: M1, M3, M5, M9, M13, M17, M21, M25, M29, M33, ED, 4WFU, LTFU, PRN   **PRN if symptomatic at other visits**  [] GC/CT NAAT urine/vaginal swab  *Rectal and oropharyngeal swab can be collected based on reported sexual history. [] GC and CT: Rectal Swab [] Oropharyngeal Swab [] TV: vaginal Swab  *If male and menstruating may take sample at next visit  []  [x]  Coagulation Tests     Visit: M13, M33  []  [x]  Hepatitis B&C     Visit: M13, M25, ED  []  [x]  Fasting Labs: Glucose     Visit: M13 and M33  (Insulin, HbA1c only collect if withdrawal visit occurs at M13 or M33)  [x]  []  Plasma for Storage     Visits: M1, M2, M3,M4, M5, M6, M7,M8, M9, M11, M13, M17, M21, M25, M29, ED, LTFU Visits  []  [x]  Predose: PK Sampling  Collect < 1 hr before injection Visits: M1, M3, M5, M9, M13, M17, M21, M25, M29  []  [x]  Illicit Drug Screen     Visits: M5, M13, M21, M29, M33  []  [x]  Urinalysis: Morning specimen is preferred     Visits: M13, M33, ED, 4WFU  Time of Collection:  LMP: No LMP for male patient.  []  N/A   Yes No/NA Lab Collection, Cont.:  []  [x]  Urine Pregnancy Test (POCBP only)  Visits: M1, M3, M5, M9, M13, M17, M21, M25, M29, M33 (  resulted prior to receipt of IP), ED, 4WFU, LTFU   Time of Collection:    [x] NA, assigned Male at birth or Not a POCBP  []                          [x]  Serum Pregnancy Test (POCBP only) only if urine  test positive, perform a serum test to confirm.   Yes No/NA Study Drug Administration  []  [x]  CAB LA Administration:     Visit: M1        Needle Size: [] 1.5  [] 2 (For BMI >/= 30)   There is no height or weight on file to calculate BMI. Gauge: [] 21G [] 22G [] 23G  Site: [] Left Ventrogluteal  [] Right Ventrogluteal  Time of Administration:    []  [x]  CAB ULA Administration:   Visit: M3, M5, M9, M13, M17, M21, M25, M29  Sites should be rotated between right and left Needle Size: [] 1.5  [] 2 (For BMI >/= 30)   There is no height or weight on file to calculate BMI.  Gauge: [] 21G [] 22G [] 23G  Site: [] Left Ventrogluteal  [] Right Ventrogluteal  Time of Administration:    [x]  []  ISR Review completed, ISR photography necessary [] Yes[x] No     All Visits *Digital photographs will be documented at all visits (scheduled or unscheduled) on all participants who have an injection site reaction that is a visible, persistent Grade 2 (no improvement/resolution in >10 days), Grade >/=3, or serious. *Examination will include a physician/HCP assessment of pain (or tenderness), pruritus, warmth, infections, rash, erythema (or redness), swelling (or induration), and nodules (granulomas or cysts) *For injection site nodules, the largest diameter will be recorded in the eCRF at each ISR assessment for nodules of at least Grade 1 severity  *Remote Visits: M6+2W, M7+2W, M8+2W, M10, M12, M15, M19, M23, M27, M31 should assess whether participants have any new ISR symptoms/signs since their last visit or have previously reported ISR that has not improved or is worsening, if present site should perform an unscheduled visits ASAP for ISR assessment   Yes No/NA  Pharmacokinetic Sampling  [x]  []   Post Dose PK sampling: 2 hours after injection on dosing days(+/- 30 mins collection window Visits:1 week, M1, M1+1Wk, M2, M3, M3+1Wk, M4, M5, M5+1W, M6, M7, M8, M9, , M9+1Wk, M11, M13, M33, ED, 4WFU, LTFU   Time of Injection:  0938 21Aug2025  Time of PK Collection: 1030    Yes No Completed:  [x]  []   HIV/STI  Counseling/Condom Distribution:    All Visits, except remote calls [] Accepted  [x] Declined   Yes No/NA Completed  []  [x]  Was there an ISR during remote phone call visit:  [] Yes []  No If yes, schedule unscheduled ISR assessment ASAP Date:   []  [x]  Instructed to contact site if ISR gets worse or does not improve after 10 days from the last ISR assessment.  [x]  []  Assess for Protocol Deviations  [x]  []  Reminders:  Participants of childbearing potential agree to use a highly effective method of contraception consistently and correctly HIV vaccines are not permitted at any time during the study No other experimental agents, antiretroviral drugs, cytotoxic chemotherapy, or radiation therapy my not be administered throughout the trial No systemically administered immunomodulator's permitted Notify study staff if you experience any signs/symptoms, visit provider in ER or scheduled evaluation, or start any new OTC, supplemental or prescription therapies   Yes No Completed:  [x]  []  Compensation:  You will receive a payment of $78.00 at the end of  each non dosing visit completed to reimburse you for your time and effort.  You will receive $125.00 at the end of each dosing visit completed to reimburse you for your time and effort.   If you must return to the clinic for an unscheduled visit, you will receive $78.   Amount: 78.00   [x]  []  Schedule Next Visit: Visits should be planned using a calendar months and scheduled based on the date of the Day 1 injection.    Remote Call Visits schedule on M6+2 wk, M7+2wk, M8+2wk, M10, M12, M15, M19, M23, M27, M31 (Assess ISR, AES, Conmeds)  If transitioning to CAB 200mg /ml do not need LTFU visits, but do need ED visit and 4 week followup visit.  Date: 02Oct2025     Time: 1000   Version 2.2 14Jul2025 JLS

## 2024-02-01 NOTE — Research (Deleted)
 [name] [id number]                                                  Protocol Amendment 02  [facility] Research Site:  [phone]                                                                  TREATMENT PHASE  Visit: []  Day1+1W     []  M1    []  M1+1W    []  M2    []  M3    []  M3+1W    []  M4    [] M5     [] M5+1W   [x] M6   [] M6+2W   [] M7   [] M7+2W   [] M8   [] M8+2W   [] M9 [] M9+1W   [] M10   [] M11   [] M12   [] M13   [] M15    [] M17   [] M19   [] M21   [] M23   [] M25   [] M27   [] M29   [] M31   [] M33   [] ED  [] 4 wk followup (if not entering LTFU)   [] LTFU M1   [] LTFU M4   [] LTFU M8   [] LTFU M12     Date: 19Sep2025         4 Letter Code: MCEA Subject ID: [id number]  Assessments should occur in the following order:  eCSSRs  All other questionnaires  Triplicate 12-lead ECG  Vital signs  Blood draws (clinical laboratory tests, pre-dose pharmacokinetics, etc)  IP injection  Post-dose pharmacokinetics (if required)  Yes No Completed:  [x]  []  Verify participant identity before doing any study procedures   All Visits  [x]  []  Verify correct version of ICF is signed   All Visits   Yes No Questionnaires Completed: Patient Reported Outcome Assessment must be completed before other assessments take place at each designated visit. Completed electronically by participants.  []  [x]  eCSSRs    Visits: M1, M3, M5, M9, M13, M17, M21, M25, M29, M33, ED Any positive  (abnormal) response indicating SIB or any unusual changes in behavior, confirmed by the investigator will result in their discontinuation of study intervention, the PI/SI will arrange for urgent specialist psychiatric evaluation and management  []  [x]  Social Determinants of Health Questionnaire (SDoH)     Visits: M17, ED  []  [x]  Study Medication Satisfaction Questionnaire (SMSQs)     Visits: M1, M3, M5, M9, M13, M17, M21, M25, M29, M33, ED  []  [x]  PrEP Preference and Rationale Questionnaire (only if prior exposure to oral PrEP)     Visits: M9, M17, M25, M33, ED  []  [x]   Acceptability of Injection Questionnaire     Visits: M1, M3, M5, M9, M13, M17, M21, M25, M29, M33, ED  []  [x]  Generalized Anxiety Disorders (GAD-&) Questionnaire     Visits: M1, M3, M5, M9, M13, M17, M21, M25, M29, M33, ED  []  [x]  HIV-risk and PrEP use Anxiety and Stigma Questionnaire prior to the study drug administration     Visits: M17, M29, M33, ED  []  [x]  Sexual Health Practices Questionnaire-administer prior to study drug administration Visits: M1, M3, M5, M9, M13, M17, M21, M25, M29, M33, ED  Yes No Completed:  [x]  []  Review Changes in Medical History    All Visits  [x]  []  Review Changes in Concomitant Medications    All Visits  [x]  []  Assess for AEs, SAEs: All AEs (from Day 1 onwards) and SAEs (from Screening onwards) must be recorded in eCRF at all visits and reported within required timelines.    All Visits  Is participant experiencing any symptomatic STIs currently? [] Yes [x] No    [x]  []  Pull Review in: Medication Review Medication Review Medication Indication Dose/Route/Frequency Start Date End Date  cetirizine  (Zyrtec )    Seasonal Allergies 10 mg tab PO QD 05Aug2023   Tylenol  Right ventrogluteal Injection Site tenderness 1000 mg PO once daily 20MAR2025 22MAR2025  Tylenol  Left Ventrogluteal Injection Site Tenderness 1000mg  PO once daily 26APR2025 26APR2025  Lotrimin AF Cream Right thigh Folliculitis 1 application daily  05SEP2025 18SEP2025  Triple Antibiotic Ointment Polymyxin B Sulfate/Bacitracin zinc, neomycin sulfate Right thigh Folliculitis 1 application daily 19SEP2025    AE Review Diagnosis/Symptoms Indicate: NSAE SAE AESI Maximum Grade Related/Not Related to study intervention CAB LA or CAB ULA Action/Dose Change Start Date (Time, if available) End Date (Time, if available)  Right ventrogluteal injection site tenderness NSAE  2 Related CAB LA none 20MAR2025 at 1000 22MAR2025 at 1200  Headache NSAE 1 Related CAB LA none 20MAR2025 at 1000 22MAR2025 at 1200  Left  ventrogluteal injection site tenderness NSAE 2 Related to CAB LA none 23APR2025 at 2200 27APR2025 at 0900  Nausea NSAE 1 Related to CAB LA none 26APR2025 at 0900  27APR2025 at 0900  Right ventrogluteal  injection site tenderness NSAE 1 Related to ULA CAB  none 25Jun2025 at 1000 29Jun2025 at 1000  Left Ventrogluteal injection site tenderness NSAE 2 Related to CAB ULA none 22Aug2025 at 0900 25Aug2025 at 0900  Right Thigh Folliculitis NSAE 2 Not Related none 05Sep2025    Medical History Review  Diagnosis Grade Start Date End Date  Stomach Ulcers 2 2021 2021  Seasonal allergies 2 2008   Dry Skin 2 05Jul2011                      [x]  []  Contraception Review: Report in [facility] Visits: M1, M2, M3, M4, M5, M6, M7, M8, M9, M11, M13, M17, M21,M25, M29, M31, M33, ED, 4WFU, LTFU Visits Male of childbearing potential: Must agree to use a highly effective method of contraception consistently and correctly, must also continue to use adequate contraception methods for at least 52 weeks after the last injection. Male condoms must be used in addition to hormonal contraception Sexual Abstinence: considered a highly effective method only if defined as refraining from heterosexual intercourse during the entire period of risk associated with the study intervention.  The reliability of sexual abstinence needs to be evaluated in relation to the duration of the study and the preferred and usual lifestyle of the participant.   Yes No/NA Completed:  []  [x]  Premature cardiovascular disease: male participant <65 years or male participant <55 years in first degree relatives only.  Note changes since the start of the study.     Months 5, 13, 21, 29      [] No Changes    []  [x]  Substance Usage: (list name of substance, amount used, start and stop dates)  Months 5, 1, 23, 75 Social History   Substance and Sexual Activity  Alcohol Use Not Currently   Comment: Started in 2017, does not normally drink alcohol  regularlly, last drink 17AUG2025  Social History   Substance and Sexual Activity  Drug Use No   Comment: Prior THC, Started 2014, stopped 01Feb2025    Tobacco Use: Low Risk  (12/30/2023)   Patient History    Smoking Tobacco Use: Never    Smokeless Tobacco Use: Never    Passive Exposure: Never    Caffeine Use:  caffeine containing beverages/day  []  [x]  Triplicate 12-lead ECG     Visit: M1, M3, M5, M9, M29 Perform in a semi-supine position after 5 minutes of rest.  3 individual ECG tracing should be obtained as closely as possible in succession, but no more than 2 minutes apart. The full set of triplicates should be completed in less than 6 minutes. If ECG abnormal clinically significant  then report as an AE/SAE.  Rest Start Time:  ECG #1 start time:     ECG #2:     ECG #3:    []  [x]  Vital Signs:  [] Weight, BMI (after 5 mins in a semi-supine position)    Only on Visits: M1, M3, M5, M9, M13, M17, M21, M25, M29, M33, ED, 4W FU, LTFU Visits  [] Height    Visit: M9  [] BP and Pulse (measured in a semi supine position after 5 minutes rest)     Visit: M1, M3, M5, M9, M13, M17, M21, M25, M29, M33, ED, 4WFU  [] Temperature and RR:    Visit: M13, ED  Rest Start Time:   Vital Signs Start Time:     There is no height or weight on file to calculate BMI.  There were no vitals filed for this visit.   [x]  []  Brief Symptom Directed Physical Examination: Will include at a minimum, assessments of skin, lungs, CV, abdomen (liver and spleen). Any abnormalities must be noted in the CRF (e.g current medical conditions or AE logs).     Visit: M1, M3, M5, M6, M7, M8, M9, M11, M13, M17, M21, M25, M29, M33, ED, 4W FU, LTFU   Physical Exam Constitutional:      Appearance: Normal appearance.  Cardiovascular:     Rate and Rhythm: Normal rate and regular rhythm.  Pulmonary:     Effort: Pulmonary effort is normal.     Breath sounds: Normal breath sounds.  Abdominal:     General: Abdomen is flat. Bowel  sounds are normal.     Palpations: Abdomen is soft.  Skin:    General: Skin is warm.     Comments: Patient states small bump to (R) thigh since 05Sep2025. States it's about size of small bead (0.40mm). Denies any drainage or redness. States mild tenderness to touch. Has been using lotrimin AF to area. Denied any itching to area. Enc to try triple antibiotic ointment to area and to stop Lotrimin AF. Plans to pick up some today and start. I did not visualized area but told the participant that if it did not resolve or he developed redness, drainage or increase tenderness or pain to go to the UC for evaluation.   Neurological:     Mental Status: He is alert.  Psychiatric:        Mood and Affect: Mood normal.        Yes No Blood Draw Tracking:  All Visits  [x]  []  Verified previous Hgb to be >= 10 (if value <10, adjust volume per unit SOP)                          [] NA, healthy volunteer, previous  Hgb results are unavailable  [x]  []  Does participant report non-study blood collection in the previous 2 months?  [] Yes, estimated vol. mL    [x]  No  [x]  []  Assess Fasting Status.   Visits: M13, M33                                                      Is participant Fasting for at least 6 hours, overnight fast preferred?  [x]  No   []  Yes, Date/Time of last meal?   [x]  []  Blood Drawn.     Problems?  [x]  No   [] Yes, why?   Time: 1030    Total Vol: 14 mL   Yes No Lab Collection:  []  [x]  RAPID HIV TESTING        Visit: M1, M3, M5, M9, M13, M17, M21, M25, M29, M33, ED, 4WFU  [] REACTIVE or [] NON REACTIVE  If positive/reactive participant must be withdrawn from the study even if subsequent confirmatory testing is negative. If reactive participant referred for further testing and clinical management as per local standard of care  Is participant experiencing any signs or symptoms consistent with acute HIV infection  [] Yes [] No   [x]  []  Chemistry     Visits: M1, M3, M4, M5, M6, M7,M8, M9, M11, M13, M17,  M21, M25, M29, M33, ED, 4WFU, LTFU  [x]  []  Hematology     Visits: M1, M3, M4, M5, M6, M7, M8, M9, M11, M13, M17, M21, M25, M29, M33, ED, 4WFU, LTFU  []  [x]  Non rapid HIV immunoassay    Visits: M1, M3, M5, M9, M13, M17, M21, M25, M29, M33, ED, 4WFU LTFU  []  [x]  HIV-1 RNA     Visits: M1, M3, M5, M9, M13, M17, M21, M25, M29, M33, ED, 4WFU, LTFU  []  [x]  Neisseria gonorrhea (GC), Chlamydia trachomatis (CT), Trichomononas vaginalis (TV), syphilis.  If positive we will refer for treatment as per local guidelines.   Visit: M1, M3, M5, M9, M13, M17, M21, M25, M29, M33, ED, 4WFU, LTFU, PRN   **PRN if symptomatic at other visits**  [] GC/CT NAAT urine/vaginal swab  *Rectal and oropharyngeal swab can be collected based on reported sexual history. [] GC and CT: Rectal Swab [] Oropharyngeal Swab [] TV: vaginal Swab  *If male and menstruating may take sample at next visit  []  [x]  Coagulation Tests     Visit: M13, M33  []  [x]  Hepatitis B&C     Visit: M13, M25, ED  []  [x]  Fasting Labs: Glucose     Visit: M13 and M33  (Insulin, HbA1c only collect if withdrawal visit occurs at M13 or M33)  [x]  []  Plasma for Storage     Visits: M1, M2, M3,M4, M5, M6, M7,M8, M9, M11, M13, M17, M21, M25, M29, ED, LTFU Visits  []  [x]  Predose: PK Sampling  Collect < 1 hr before injection Visits: M1, M3, M5, M9, M13, M17, M21, M25, M29  []  [x]  Illicit Drug Screen     Visits: M5, M13, M21, M29, M33  []  [x]  Urinalysis: Morning specimen is preferred     Visits: M13, M33, ED, 4WFU  Time of Collection:  LMP: No LMP for male patient.  []  N/A   Yes No/NA Lab Collection, Cont.:  []  [x]  Urine Pregnancy Test (POCBP only)  Visits: M1, M3, M5, M9, M13, M17, M21, M25, M29, M33 (resulted prior to receipt  of IP), ED, 4WFU, LTFU   Time of Collection:    [x] NA, assigned Male at birth or Not a POCBP  []                          [x]  Serum Pregnancy Test (POCBP only) only if urine test positive, perform a serum test to confirm.   Yes No/NA Study  Drug Administration  []  [x]  CAB LA Administration:     Visit: M1        Needle Size: [] 1.5  [] 2 (For BMI >/= 30)   There is no height or weight on file to calculate BMI. Gauge: [] 21G [] 22G [] 23G  Site: [] Left Ventrogluteal  [] Right Ventrogluteal  Time of Administration:    []  [x]  CAB ULA Administration:   Visit: M3, M5, M9, M13, M17, M21, M25, M29  Sites should be rotated between right and left Needle Size: [] 1.5  [] 2 (For BMI >/= 30)   There is no height or weight on file to calculate BMI.  Gauge: [] 21G [] 22G [] 23G  Site: [] Left Ventrogluteal  [] Right Ventrogluteal  Time of Administration:    [x]  []  ISR Review completed, ISR photography necessary [] Yes[x] No     All Visits *Digital photographs will be documented at all visits (scheduled or unscheduled) on all participants who have an injection site reaction that is a visible, persistent Grade 2 (no improvement/resolution in >10 days), Grade >/=3, or serious. *Examination will include a physician/HCP assessment of pain (or tenderness), pruritus, warmth, infections, rash, erythema (or redness), swelling (or induration), and nodules (granulomas or cysts) *For injection site nodules, the largest diameter will be recorded in the eCRF at each ISR assessment for nodules of at least Grade 1 severity  *Remote Visits: M6+2W, M7+2W, M8+2W, M10, M12, M15, M19, M23, M27, M31 should assess whether participants have any new ISR symptoms/signs since their last visit or have previously reported ISR that has not improved or is worsening, if present site should perform an unscheduled visits ASAP for ISR assessment   Yes No/NA  Pharmacokinetic Sampling  [x]  []   Post Dose PK sampling: 2 hours after injection on dosing days(+/- 30 mins collection window Visits:1 week, M1, M1+1Wk, M2, M3, M3+1Wk, M4, M5, M5+1W, M6, M7, M8, M9, , M9+1Wk, M11, M13, M33, ED, 4WFU, LTFU   Time of Injection: 0938 21Aug2025  Time of PK Collection: 1030    Yes No Completed:   [x]  []   HIV/STI  Counseling/Condom Distribution:    All Visits, except remote calls [] Accepted  [x] Declined   Yes No/NA Completed  []  [x]  Was there an ISR during remote phone call visit:  [] Yes []  No If yes, schedule unscheduled ISR assessment ASAP Date:   []  [x]  Instructed to contact site if ISR gets worse or does not improve after 10 days from the last ISR assessment.  [x]  []  Assess for Protocol Deviations  [x]  []  Reminders:  Participants of childbearing potential agree to use a highly effective method of contraception consistently and correctly HIV vaccines are not permitted at any time during the study No other experimental agents, antiretroviral drugs, cytotoxic chemotherapy, or radiation therapy my not be administered throughout the trial No systemically administered immunomodulator's permitted Notify study staff if you experience any signs/symptoms, visit provider in ER or scheduled evaluation, or start any new OTC, supplemental or prescription therapies   Yes No Completed:  [x]  []  Compensation:  You will receive a payment of $78.00 at the end of each non dosing visit  completed to reimburse you for your time and effort.  You will receive $125.00 at the end of each dosing visit completed to reimburse you for your time and effort.   If you must return to the clinic for an unscheduled visit, you will receive $78.   Amount: 78.00   [x]  []  Schedule Next Visit: Visits should be planned using a calendar months and scheduled based on the date of the Day 1 injection.    Remote Call Visits schedule on M6+2 wk, M7+2wk, M8+2wk, M10, M12, M15, M19, M23, M27, M31 (Assess ISR, AES, Conmeds)  If transitioning to CAB 200mg /ml do not need LTFU visits, but do need ED visit and 4 week followup visit.  Date: 02Oct2025     Time: 1000   Version 2.2 14Jul2025 JLS

## 2024-02-01 NOTE — Research (Signed)
 [name] [id number]                                                  Protocol Amendment 02  [facility]:  [phone]                                                                  TREATMENT PHASE  Visit: []  Day1+1W     []  M1    []  M1+1W    []  M2    []  M3    []  M3+1W    []  M4    [] M5     [x] M5+1W   [] M6   [] M6+2W   [] M7   [] M7+2W   [] M8   [] M8+2W   [] M9 [] M9+1W   [] M10   [] M11   [] M12   [] M13   [] M15    [] M17   [] M19   [] M21   [] M23   [] M25   [] M27   [] M29   [] M31   [] M33   [] ED  [] 4 wk followup (if not entering LTFU)   [] LTFU M1   [] LTFU M4   [] LTFU M8   [] LTFU M12     Date: 28Aug2025         4 Letter Code: MCEA Subject ID: [id number]  Assessments should occur in the following order:  eCSSRs  All other questionnaires  Triplicate 12-lead ECG  Vital signs  Blood draws (clinical laboratory tests, pre-dose pharmacokinetics, etc)  IP injection  Post-dose pharmacokinetics (if required)  Yes No Completed:  [x]  []  Verify participant identity before doing any study procedures   All Visits  [x]  []  Verify correct version of ICF is signed   All Visits   Yes No Questionnaires Completed: Patient Reported Outcome Assessment must be completed before other assessments take place at each designated visit. Completed electronically by participants.  []  [x]  eCSSRs    Visits: M1, M3, M5, M9, M13, M17, M21, M25, M29, M33, ED Any positive  (abnormal) response indicating SIB or any unusual changes in behavior, confirmed by the investigator will result in their discontinuation of study intervention, the PI/SI will arrange for urgent specialist psychiatric evaluation and management  []  [x]  Social Determinants of Health Questionnaire (SDoH)     Visits: M17, ED  []  [x]  Study Medication Satisfaction Questionnaire (SMSQs)     Visits: M1, M3, M5, M9, M13, M17, M21, M25, M29, M33, ED  []  [x]  PrEP Preference and Rationale Questionnaire (only if prior exposure to oral PrEP)     Visits: M9, M17, M25, M33, ED  []  [x]   Acceptability of Injection Questionnaire     Visits: M1, M3, M5, M9, M13, M17, M21, M25, M29, M33, ED  []  [x]  Generalized Anxiety Disorders (GAD-&) Questionnaire     Visits: M1, M3, M5, M9, M13, M17, M21, M25, M29, M33, ED  []  [x]  HIV-risk and PrEP use Anxiety and Stigma Questionnaire prior to the study drug administration     Visits: M17, M29, M33, ED  []  [x]  Sexual Health Practices Questionnaire-administer prior to study drug administration Visits: M1, M3, M5, M9, M13, M17, M21, M25, M29, M33, ED  Yes No Completed:  [x]  []  Review Changes in Medical History    All Visits  [x]  []  Review Changes in Concomitant Medications    All Visits  [x]  []  Assess for AEs, SAEs: All AEs (from Day 1 onwards) and SAEs (from Screening onwards) must be recorded in eCRF at all visits and reported within required timelines.    All Visits  Is participant experiencing any symptomatic STIs currently? [] Yes [x] No    [x]  []  Pull Review in: Medication Review Medication Review Medication Review Medication Indication Dose/Route/Frequency Start Date End Date  cetirizine  (Zyrtec )    Seasonal Allergies 10 mg tab PO QD 05Aug2023   Tylenol  Right ventrogluteal Injection Site tenderness 1000 mg PO once daily 20MAR2025 22MAR2025  Tylenol  Left Ventrogluteal Injection Site Tenderness 1000mg  PO once daily 26APR2025 26APR2025  Lotrimin AF Cream Right thigh Folliculitis 1 application daily  05SEP2025 18SEP2025  Triple Antibiotic Ointment Polymyxin B Sulfate/Bacitracin zinc, neomycin sulfate Right thigh Folliculitis 1 application daily 19SEP2025    AE Review Diagnosis/Symptoms Indicate: NSAE SAE AESI Maximum Grade Related/Not Related to study intervention CAB LA or CAB ULA Action/Dose Change Start Date (Time, if available) End Date (Time, if available)  Right ventrogluteal injection site tenderness NSAE  2 Related CAB LA none 20MAR2025 at 1000 22MAR2025 at 1200  Headache NSAE 1 Related CAB LA none 20MAR2025 at 1000  22MAR2025 at 1200  Left ventrogluteal injection site tenderness NSAE 2 Related to CAB LA none 23APR2025 at 2200 27APR2025 at 0900  Nausea NSAE 1 Related to CAB LA none 26APR2025 at 0900  27APR2025 at 0900  Right ventrogluteal  injection site tenderness NSAE 1 Related to ULA CAB  none 25Jun2025 at 1000 29Jun2025 at 1000  Left Ventrogluteal injection site tenderness NSAE 2 Related to CAB ULA none 22Aug2025 at 0900 25Aug2025 at 0900  Right Thigh Folliculitis NSAE 2 Not Related none 05Sep2025    Medical History Review  Diagnosis Grade Start Date End Date  Stomach Ulcers 2 2021 2021  Seasonal allergies 2 2008   Dry Skin 2 05Jul2011                  []  [x]  Contraception Review: Report in [facility] Visits: M1, M2, M3, M4, M5, M6, M7, M8, M9, M11, M13, M17, M21,M25, M29, M31, M33, ED, 4WFU, LTFU Visits Male of childbearing potential: Must agree to use a highly effective method of contraception consistently and correctly, must also continue to use adequate contraception methods for at least 52 weeks after the last injection. Male condoms must be used in addition to hormonal contraception Sexual Abstinence: considered a highly effective method only if defined as refraining from heterosexual intercourse during the entire period of risk associated with the study intervention.  The reliability of sexual abstinence needs to be evaluated in relation to the duration of the study and the preferred and usual lifestyle of the participant.   Yes No/NA Completed:  []  [x]  Premature cardiovascular disease: male participant <65 years or male participant <55 years in first degree relatives only.  Note changes since the start of the study.     Months 5, 13, 21, 29      [] No Changes    []  [x]  Substance Usage: (list name of substance, amount used, start and stop dates)  Months 5, 23, 59, 21 Social History   Substance and Sexual Activity  Alcohol Use Not Currently   Comment: Started in 2017, does not normally  drink alcohol regularlly, last drink 17AUG2025    Social History  Substance and Sexual Activity  Drug Use No   Comment: Prior THC, Started 2014, stopped 01Feb2025    Tobacco Use: Low Risk  (12/30/2023)   Patient History    Smoking Tobacco Use: Never    Smokeless Tobacco Use: Never    Passive Exposure: Never    Caffeine Use:  caffeine containing beverages/day  []  [x]  Triplicate 12-lead ECG     Visit: M1, M3, M5, M9, M29 Perform in a semi-supine position after 5 minutes of rest.  3 individual ECG tracing should be obtained as closely as possible in succession, but no more than 2 minutes apart. The full set of triplicates should be completed in less than 6 minutes. If ECG abnormal clinically significant  then report as an AE/SAE.  Rest Start Time:  ECG #1 start time:     ECG #2:     ECG #3:    []  [x]  Vital Signs:  [] Weight, BMI (after 5 mins in a semi-supine position)    Only on Visits: M1, M3, M5, M9, M13, M17, M21, M25, M29, M33, ED, 4W FU, LTFU Visits  [] Height    Visit: M9  [] BP and Pulse (measured in a semi supine position after 5 minutes rest)     Visit: M1, M3, M5, M9, M13, M17, M21, M25, M29, M33, ED, 4WFU  [] Temperature and RR:    Visit: M13, ED  Rest Start Time:   Vital Signs Start Time:     There is no height or weight on file to calculate BMI.  There were no vitals filed for this visit.   []  [x]  Brief Symptom Directed Physical Examination: Will include at a minimum, assessments of skin, lungs, CV, abdomen (liver and spleen). Any abnormalities must be noted in the CRF (e.g current medical conditions or AE logs).     Visit: M1, M3, M5, M6, M7, M8, M9, M11, M13, M17, M21, M25, M29, M33, ED, 4W FU, LTFU      Yes No Blood Draw Tracking:  All Visits  [x]  []  Verified previous Hgb to be >= 10 (if value <10, adjust volume per unit SOP)                          [] NA, healthy volunteer, previous Hgb results are unavailable  [x]  []  Does participant report non-study blood  collection in the previous 2 months?  [] Yes, estimated vol. mL    [x]  No  [x]  []  Assess Fasting Status.   Visits: M13, M33                                                      Is participant Fasting for at least 6 hours, overnight fast preferred?  [x]  No   []  Yes, Date/Time of last meal?   [x]  []  Blood Drawn.     Problems?  [x]  No   [] Yes, why?   Time: 0938    Total Vol: 2 mL   Yes No Lab Collection:  []  [x]  RAPID HIV TESTING        Visit: M1, M3, M5, M9, M13, M17, M21, M25, M29, M33, ED, 4WFU  [] REACTIVE or [] NON REACTIVE  If positive/reactive participant must be withdrawn from the study even if subsequent confirmatory testing is negative. If reactive participant referred for further testing and clinical management  as per local standard of care  Is participant experiencing any signs or symptoms consistent with acute HIV infection  [] Yes [] No   []  [x]  Chemistry     Visits: M1, M3, M4, M5, M6, M7,M8, M9, M11, M13, M17, M21, M25, M29, M33, ED, 4WFU, LTFU  []  [x]  Hematology     Visits: M1, M3, M4, M5, M6, M7, M8, M9, M11, M13, M17, M21, M25, M29, M33, ED, 4WFU, LTFU  []  [x]  Non rapid HIV immunoassay    Visits: M1, M3, M5, M9, M13, M17, M21, M25, M29, M33, ED, 4WFU LTFU  []  [x]  HIV-1 RNA     Visits: M1, M3, M5, M9, M13, M17, M21, M25, M29, M33, ED, 4WFU, LTFU  []  [x]  Neisseria gonorrhea (GC), Chlamydia trachomatis (CT), Trichomononas vaginalis (TV), syphilis.  If positive we will refer for treatment as per local guidelines.   Visit: M1, M3, M5, M9, M13, M17, M21, M25, M29, M33, ED, 4WFU, LTFU, PRN   **PRN if symptomatic at other visits**  [] GC/CT NAAT urine/vaginal swab  *Rectal and oropharyngeal swab can be collected based on reported sexual history. [] GC and CT: Rectal Swab [] Oropharyngeal Swab [] TV: vaginal Swab  *If male and menstruating may take sample at next visit  []  [x]  Coagulation Tests     Visit: M13, M33  []  [x]  Hepatitis B&C     Visit: M13, M25, ED  []  [x]  Fasting  Labs: Glucose     Visit: M13 and M33  (Insulin, HbA1c only collect if withdrawal visit occurs at M13 or M33)  []  [x]  Plasma for Storage     Visits: M1, M2, M3,M4, M5, M6, M7,M8, M9, M11, M13, M17, M21, M25, M29, ED, LTFU Visits  []  [x]  Predose: PK Sampling  Collect < 1 hr before injection Visits: M1, M3, M5, M9, M13, M17, M21, M25, M29  []  [x]  Illicit Drug Screen     Visits: M5, M13, M21, M29, M33  []  [x]  Urinalysis: Morning specimen is preferred     Visits: M13, M33, ED, 4WFU  Time of Collection:   LMP: No LMP for male patient.  []  N/A   Yes No/NA Lab Collection, Cont.:  []  [x]  Urine Pregnancy Test (POCBP only)  Visits: M1, M3, M5, M9, M13, M17, M21, M25, M29, M33 (resulted prior to receipt of IP), ED, 4WFU, LTFU   Time of Collection:     [] NA, assigned Male at birth or Not a POCBP  []                          [x]  Serum Pregnancy Test (POCBP only) only if urine test positive, perform a serum test to confirm.   Yes No/NA Study Drug Administration  []  [x]  CAB LA Administration:     Visit: M1        Needle Size: [] 1.5  [] 2 (For BMI >/= 30)   There is no height or weight on file to calculate BMI. Gauge: [] 21G [] 22G [] 23G  Site: [] Left Ventrogluteal  [] Right Ventrogluteal  Time of Administration:    []  [x]  CAB ULA Administration:   Visit: M3, M5, M9, M13, M17, M21, M25, M29  Sites should be rotated between right and left Needle Size: [] 1.5  [] 2 (For BMI >/= 30)   There is no height or weight on file to calculate BMI.  Gauge: [] 21G [] 22G [] 23G  Site: [] Left Ventrogluteal  [] Right Ventrogluteal  Time of Administration:    [x]  []  ISR Review completed, ISR photography necessary []   Yes[x] No     All Visits *Digital photographs will be documented at all visits (scheduled or unscheduled) on all participants who have an injection site reaction that is a visible, persistent Grade 2 (no improvement/resolution in >10 days), Grade >/=3, or serious. *Examination will include a physician/HCP  assessment of pain (or tenderness), pruritus, warmth, infections, rash, erythema (or redness), swelling (or induration), and nodules (granulomas or cysts) *For injection site nodules, the largest diameter will be recorded in the eCRF at each ISR assessment for nodules of at least Grade 1 severity  *Remote Visits: M6+2W, M7+2W, M8+2W, M10, M12, M15, M19, M23, M27, M31 should assess whether participants have any new ISR symptoms/signs since their last visit or have previously reported ISR that has not improved or is worsening, if present site should perform an unscheduled visits ASAP for ISR assessment   Yes No/NA  Pharmacokinetic Sampling  [x]  []   Post Dose PK sampling: 2 hours after injection on dosing days(+/- 30 mins collection window Visits:1 week, M1, M1+1Wk, M2, M3, M3+1Wk, M4, M5, M5+1W, M6, M7, M8, M9, , M9+1Wk, M11, M13, M33, ED, 4WFU, LTFU   Time of Injection: 21Aug2025 0938  Time of PK Collection: 0938    Yes No Completed:  [x]  []   HIV/STI  Counseling/Condom Distribution:    All Visits, except remote calls [] Accepted  [x] Declined   Yes No/NA Completed  []  [x]  Was there an ISR during remote phone call visit:  [] Yes []  No If yes, schedule unscheduled ISR assessment ASAP Date:   [x]  []  Instructed to contact site if ISR gets worse or does not improve after 10 days from the last ISR assessment.  [x]  []  Assess for Protocol Deviations  [x]  []  Reminders:  Participants of childbearing potential agree to use a highly effective method of contraception consistently and correctly HIV vaccines are not permitted at any time during the study No other experimental agents, antiretroviral drugs, cytotoxic chemotherapy, or radiation therapy my not be administered throughout the trial No systemically administered immunomodulator's permitted Notify study staff if you experience any signs/symptoms, visit provider in ER or scheduled evaluation, or start any new OTC, supplemental or prescription  therapies   Yes No Completed:  [x]  []  Compensation:  You will receive a payment of $78.00 at the end of each non dosing visit completed to reimburse you for your time and effort.  You will receive $125.00 at the end of each dosing visit completed to reimburse you for your time and effort.   If you must return to the clinic for an unscheduled visit, you will receive $78.   Amount: $78   [x]  []  Schedule Next Visit: Visits should be planned using a calendar months and scheduled based on the date of the Day 1 injection.    Remote Call Visits schedule on M6+2 wk, M7+2wk, M8+2wk, M10, M12, M15, M19, M23, M27, M31 (Assess ISR, AES, Conmeds)  If transitioning to CAB 200mg /ml do not need LTFU visits, but do need ED visit and 4 week followup visit.  Date: 18Sep2025     Time: 1000   Version 2.2 14Jul2025 JLS

## 2024-02-07 NOTE — Progress Notes (Signed)
 I have reviewed data from this participant's EXTEND [id number]  visit including laboratory values.

## 2024-02-09 ENCOUNTER — Encounter: Payer: PRIVATE HEALTH INSURANCE | Admitting: *Deleted

## 2024-02-09 ENCOUNTER — Other Ambulatory Visit: Payer: Self-pay

## 2024-02-09 DIAGNOSIS — Z006 Encounter for examination for normal comparison and control in clinical research program: Secondary | ICD-10-CM

## 2024-02-09 NOTE — Research (Signed)
 [name] [id number]                                                  Protocol Amendment 02  [facility]:  [phone]                                                                  TREATMENT PHASE  Visit: []  Day1+1W     []  M1    []  M1+1W    []  M2    []  M3    []  M3+1W    []  M4    [] M5     [] M5+1W   [] M6   [x] M6+2W   [] M7   [] M7+2W   [] M8   [] M8+2W   [] M9 [] M9+1W   [] M10   [] M11   [] M12   [] M13   [] M15    [] M17   [] M19   [] M21   [] M23   [] M25   [] M27   [] M29   [] M31   [] M33   [] ED  [] 4 wk followup (if not entering LTFU)   [] LTFU M1   [] LTFU M4   [] LTFU M8   [] LTFU M12     Date: 02Oct2025         4 Letter Code: MCEA Subject ID: [id number]  Assessments should occur in the following order:  eCSSRs  All other questionnaires  Triplicate 12-lead ECG  Vital signs  Blood draws (clinical laboratory tests, pre-dose pharmacokinetics, etc)  IP injection  Post-dose pharmacokinetics (if required)  Yes No Completed:  [x]  []  Verify participant identity before doing any study procedures   All Visits  [x]  []  Verify correct version of ICF is signed   All Visits   Yes No Questionnaires Completed: Patient Reported Outcome Assessment must be completed before other assessments take place at each designated visit. Completed electronically by participants.  []  [x]  eCSSRs    Visits: M1, M3, M5, M9, M13, M17, M21, M25, M29, M33, ED Any positive  (abnormal) response indicating SIB or any unusual changes in behavior, confirmed by the investigator will result in their discontinuation of study intervention, the PI/SI will arrange for urgent specialist psychiatric evaluation and management  []  [x]  Social Determinants of Health Questionnaire (SDoH)     Visits: M17, ED  []  [x]  Study Medication Satisfaction Questionnaire (SMSQs)     Visits: M1, M3, M5, M9, M13, M17, M21, M25, M29, M33, ED  []  [x]  PrEP Preference and Rationale Questionnaire (only if prior exposure to oral PrEP)     Visits: M9, M17, M25, M33, ED  []  [x]   Acceptability of Injection Questionnaire     Visits: M1, M3, M5, M9, M13, M17, M21, M25, M29, M33, ED  []  [x]  Generalized Anxiety Disorders (GAD-&) Questionnaire     Visits: M1, M3, M5, M9, M13, M17, M21, M25, M29, M33, ED  []  [x]  HIV-risk and PrEP use Anxiety and Stigma Questionnaire prior to the study drug administration     Visits: M17, M29, M33, ED  []  [x]  Sexual Health Practices Questionnaire-administer prior to study drug administration Visits: M1, M3, M5, M9, M13, M17, M21, M25, M29, M33, ED  Yes No Completed:  [x]  []  Review Changes in Medical History    All Visits  [x]  []  Review Changes in Concomitant Medications    All Visits  [x]  []  Assess for AEs, SAEs: All AEs (from Day 1 onwards) and SAEs (from Screening onwards) must be recorded in eCRF at all visits and reported within required timelines.    All Visits  Is participant experiencing any symptomatic STIs currently? [] Yes [x] No    [x]  []  Pull Review in: Medication Review Medication Indication Dose/Route/Frequency Start Date End Date  cetirizine  (Zyrtec )    Seasonal Allergies 10 mg tab PO QD 05Aug2023   Tylenol  Right ventrogluteal Injection Site tenderness 1000 mg PO once daily 20MAR2025 22MAR2025  Tylenol  Left Ventrogluteal Injection Site Tenderness 1000mg  PO once daily 26APR2025 26APR2025  Lotrimin AF Cream Small Bump (approx. 0.75mm) Right Thigh, Furuncle 1 application daily  05SEP2025 18SEP2025  Triple Antibiotic Ointment Polymyxin B Sulfate/Bacitracin zinc, neomycin sulfate Small Bump (approx. 0.27mm) Right Thigh, Furuncle 1 application daily 19SEP2025 30Sep2025   AE Review Diagnosis/Symptoms Indicate: NSAE SAE AESI Maximum Grade Related/Not Related to study intervention CAB LA or CAB ULA Action/Dose Change Start Date (Time, if available) End Date (Time, if available)  Right ventrogluteal injection site tenderness NSAE  2 Related CAB LA none 20MAR2025 at 1000 22MAR2025 at 1200  Headache NSAE 1 Related CAB LA none  20MAR2025 at 1000 22MAR2025 at 1200  Left ventrogluteal injection site tenderness NSAE 2 Related to CAB LA none 23APR2025 at 2200 27APR2025 at 0900  Nausea NSAE 1 Related to CAB LA none 26APR2025 at 0900  27APR2025 at 0900  Right ventrogluteal  injection site tenderness NSAE 1 Related to ULA CAB  none 25Jun2025 at 1000 29Jun2025 at 1000  Left Ventrogluteal injection site tenderness NSAE 2 Related to CAB ULA none 22Aug2025 at 0900 25Aug2025 at 0900  Small Bump (approx. 0.2mm) Right Thigh, Furuncle NSAE 2 Not Related none 05Sep2025 30Sep2025   Medical History Review  Diagnosis Grade Start Date End Date  Stomach Ulcers 2 2021 2021  Seasonal allergies 2 2008   Dry Skin 2 05Jul2011                  []  [x]  Contraception Review: Report in [facility] Visits: M1, M2, M3, M4, M5, M6, M7, M8, M9, M11, M13, M17, M21,M25, M29, M31, M33, ED, 4WFU, LTFU Visits Male of childbearing potential: Must agree to use a highly effective method of contraception consistently and correctly, must also continue to use adequate contraception methods for at least 52 weeks after the last injection. Male condoms must be used in addition to hormonal contraception Sexual Abstinence: considered a highly effective method only if defined as refraining from heterosexual intercourse during the entire period of risk associated with the study intervention.  The reliability of sexual abstinence needs to be evaluated in relation to the duration of the study and the preferred and usual lifestyle of the participant.   Yes No/NA Completed:  []  [x]  Premature cardiovascular disease: male participant <65 years or male participant <55 years in first degree relatives only.  Note changes since the start of the study.     Months 5, 13, 21, 29      [] No Changes    []  [x]  Substance Usage: (list name of substance, amount used, start and stop dates)  Months 5, 33, 38, 17 Social History   Substance and Sexual Activity  Alcohol Use Not Currently    Comment: Started in 2017, does not normally drink alcohol regularlly,  last drink 17AUG2025    Social History   Substance and Sexual Activity  Drug Use No   Comment: Prior THC, Started 2014, stopped 01Feb2025    Tobacco Use: Low Risk  (12/30/2023)   Patient History    Smoking Tobacco Use: Never    Smokeless Tobacco Use: Never    Passive Exposure: Never    Caffeine Use:  caffeine containing beverages/day  []  [x]  Triplicate 12-lead ECG     Visit: M1, M3, M5, M9, M29 Perform in a semi-supine position after 5 minutes of rest.  3 individual ECG tracing should be obtained as closely as possible in succession, but no more than 2 minutes apart. The full set of triplicates should be completed in less than 6 minutes. If ECG abnormal clinically significant  then report as an AE/SAE.  Rest Start Time:  ECG #1 start time:     ECG #2:     ECG #3:    []  [x]  Vital Signs:  [] Weight, BMI (after 5 mins in a semi-supine position)    Only on Visits: M1, M3, M5, M9, M13, M17, M21, M25, M29, M33, ED, 4W FU, LTFU Visits  [] Height    Visit: M9  [] BP and Pulse (measured in a semi supine position after 5 minutes rest)     Visit: M1, M3, M5, M9, M13, M17, M21, M25, M29, M33, ED, 4WFU  [] Temperature and RR:    Visit: M13, ED  Rest Start Time:   Vital Signs Start Time:     There is no height or weight on file to calculate BMI.  There were no vitals filed for this visit.   []  [x]  Brief Symptom Directed Physical Examination: Will include at a minimum, assessments of skin, lungs, CV, abdomen (liver and spleen). Any abnormalities must be noted in the CRF (e.g current medical conditions or AE logs).     Visit: M1, M3, M5, M6, M7, M8, M9, M11, M13, M17, M21, M25, M29, M33, ED, 4W FU, LTFU      Yes No Blood Draw Tracking:  All Visits  []  [x]  Verified previous Hgb to be >= 10 (if value <10, adjust volume per unit SOP)                          [] NA, healthy volunteer, previous Hgb results are unavailable  []   [x]  Does participant report non-study blood collection in the previous 2 months?  [] Yes, estimated vol. mL    []  No  []  [x]  Assess Fasting Status.   Visits: M13, M33                                                      Is participant Fasting for at least 6 hours, overnight fast preferred?  []  No   []  Yes, Date/Time of last meal?   []  [x]  Blood Drawn.     Problems?  []  No   [] Yes, why?   Time:     Total Vol:  mL   Yes No Lab Collection:  []  [x]  RAPID HIV TESTING        Visit: M1, M3, M5, M9, M13, M17, M21, M25, M29, M33, ED, 4WFU  [] REACTIVE or [] NON REACTIVE  If positive/reactive participant must be withdrawn from the study even if subsequent confirmatory testing is negative.  If reactive participant referred for further testing and clinical management as per local standard of care  Is participant experiencing any signs or symptoms consistent with acute HIV infection  [] Yes [] No   []  [x]  Chemistry     Visits: M1, M3, M4, M5, M6, M7,M8, M9, M11, M13, M17, M21, M25, M29, M33, ED, 4WFU, LTFU  []  [x]  Hematology     Visits: M1, M3, M4, M5, M6, M7, M8, M9, M11, M13, M17, M21, M25, M29, M33, ED, 4WFU, LTFU  []  [x]  Non rapid HIV immunoassay    Visits: M1, M3, M5, M9, M13, M17, M21, M25, M29, M33, ED, 4WFU LTFU  []  [x]  HIV-1 RNA     Visits: M1, M3, M5, M9, M13, M17, M21, M25, M29, M33, ED, 4WFU, LTFU  []  [x]  Neisseria gonorrhea (GC), Chlamydia trachomatis (CT), Trichomononas vaginalis (TV), syphilis.  If positive we will refer for treatment as per local guidelines.   Visit: M1, M3, M5, M9, M13, M17, M21, M25, M29, M33, ED, 4WFU, LTFU, PRN   **PRN if symptomatic at other visits**  [] GC/CT NAAT urine/vaginal swab  *Rectal and oropharyngeal swab can be collected based on reported sexual history. [] GC and CT: Rectal Swab [] Oropharyngeal Swab [] TV: vaginal Swab  *If male and menstruating may take sample at next visit  []  [x]  Coagulation Tests     Visit: M13, M33  []  [x]  Hepatitis B&C      Visit: M13, M25, ED  []  [x]  Fasting Labs: Glucose     Visit: M13 and M33  (Insulin, HbA1c only collect if withdrawal visit occurs at M13 or M33)  []  [x]  Plasma for Storage     Visits: M1, M2, M3,M4, M5, M6, M7,M8, M9, M11, M13, M17, M21, M25, M29, ED, LTFU Visits  []  [x]  Predose: PK Sampling  Collect < 1 hr before injection Visits: M1, M3, M5, M9, M13, M17, M21, M25, M29  []  [x]  Illicit Drug Screen     Visits: M5, M13, M21, M29, M33  []  [x]  Urinalysis: Morning specimen is preferred     Visits: M13, M33, ED, 4WFU  Time of Collection:   LMP: No LMP for male patient.  []  N/A   Yes No/NA Lab Collection, Cont.:  []  [x]  Urine Pregnancy Test (POCBP only)  Visits: M1, M3, M5, M9, M13, M17, M21, M25, M29, M33 (resulted prior to receipt of IP), ED, 4WFU, LTFU   Time of Collection:     [] NA, assigned Male at birth or Not a POCBP  []                          [x]   Serum Pregnancy Test (POCBP only) only if urine test positive, perform a serum test to confirm.   Yes No/NA Study Drug Administration  []  [x]  CAB LA Administration:     Visit: M1        Needle Size: [] 1.5  [] 2 (For BMI >/= 30)   There is no height or weight on file to calculate BMI. Gauge: [] 21G [] 22G [] 23G  Site: [] Left Ventrogluteal  [] Right Ventrogluteal  Time of Administration:    []  [x]  CAB ULA Administration:   Visit: M3, M5, M9, M13, M17, M21, M25, M29  Sites should be rotated between right and left Needle Size: [] 1.5  [] 2 (For BMI >/= 30)   There is no height or weight on file to calculate BMI.  Gauge: [] 21G [] 22G [] 23G  Site: [] Left Ventrogluteal  [] Right Ventrogluteal  Time of Administration:    [  x] []  ISR Review completed, ISR photography necessary [] Yes[x] No     All Visits *Digital photographs will be documented at all visits (scheduled or unscheduled) on all participants who have an injection site reaction that is a visible, persistent Grade 2 (no improvement/resolution in >10 days), Grade >/=3, or  serious. *Examination will include a physician/HCP assessment of pain (or tenderness), pruritus, warmth, infections, rash, erythema (or redness), swelling (or induration), and nodules (granulomas or cysts) *For injection site nodules, the largest diameter will be recorded in the eCRF at each ISR assessment for nodules of at least Grade 1 severity  *Remote Visits: M6+2W, M7+2W, M8+2W, M10, M12, M15, M19, M23, M27, M31 should assess whether participants have any new ISR symptoms/signs since their last visit or have previously reported ISR that has not improved or is worsening, if present site should perform an unscheduled visits ASAP for ISR assessment   Yes No/NA  Pharmacokinetic Sampling  []  [x]   Post Dose PK sampling: 2 hours after injection on dosing days(+/- 30 mins collection window Visits:1 week, M1, M1+1Wk, M2, M3, M3+1Wk, M4, M5, M5+1W, M6, M7, M8, M9, , M9+1Wk, M11, M13, M33, ED, 4WFU, LTFU   Time of Injection:   Time of PK Collection:     Yes No Completed:  []  [x]   HIV/STI  Counseling/Condom Distribution:    All Visits, except remote calls [] Accepted  [] Declined   Yes No/NA Completed  [x]  []  Was there an ISR during remote phone call visit:  [] Yes [x]  No If yes, schedule unscheduled ISR assessment ASAP Date:   []  [x]  Instructed to contact site if ISR gets worse or does not improve after 10 days from the last ISR assessment.  [x]  []  Assess for Protocol Deviations  [x]  []  Reminders:  Participants of childbearing potential agree to use a highly effective method of contraception consistently and correctly HIV vaccines are not permitted at any time during the study No other experimental agents, antiretroviral drugs, cytotoxic chemotherapy, or radiation therapy my not be administered throughout the trial No systemically administered immunomodulator's permitted Notify study staff if you experience any signs/symptoms, visit provider in ER or scheduled evaluation, or start any new OTC,  supplemental or prescription therapies   Yes No Completed:  [x]  []  Compensation:  You will receive a payment of $78.00 at the end of each non dosing visit completed to reimburse you for your time and effort.  You will receive $125.00 at the end of each dosing visit completed to reimburse you for your time and effort.   If you must return to the clinic for an unscheduled visit, you will receive $78.   Amount: $78   [x]  []  Schedule Next Visit: Visits should be planned using a calendar months and scheduled based on the date of the Day 1 injection.    Remote Call Visits schedule on M6+2 wk, M7+2wk, M8+2wk, M10, M12, M15, M19, M23, M27, M31 (Assess ISR, AES, Conmeds)  If transitioning to CAB 200mg /ml do not need LTFU visits, but do need ED visit and 4 week followup visit.  Date: 16Oct2025     Time: 1000   Version 2.2 14Jul2025 JLS

## 2024-02-12 ENCOUNTER — Emergency Department (HOSPITAL_BASED_OUTPATIENT_CLINIC_OR_DEPARTMENT_OTHER)
Admission: EM | Admit: 2024-02-12 | Discharge: 2024-02-12 | Disposition: A | Discharge status: Home / Self Care | Payer: PRIVATE HEALTH INSURANCE | Attending: Emergency Medicine | Admitting: Emergency Medicine

## 2024-02-12 ENCOUNTER — Encounter (HOSPITAL_BASED_OUTPATIENT_CLINIC_OR_DEPARTMENT_OTHER): Payer: Self-pay | Admitting: Emergency Medicine

## 2024-02-12 DIAGNOSIS — R369 Urethral discharge, unspecified: Secondary | ICD-10-CM | POA: Insufficient documentation

## 2024-02-12 DIAGNOSIS — N342 Other urethritis: Secondary | ICD-10-CM

## 2024-02-12 DIAGNOSIS — R079 Chest pain, unspecified: Secondary | ICD-10-CM | POA: Diagnosis not present

## 2024-02-12 LAB — URINALYSIS, ROUTINE W REFLEX MICROSCOPIC
Bilirubin Urine: NEGATIVE
Glucose, UA: NEGATIVE mg/dL
Ketones, ur: NEGATIVE mg/dL
Nitrite: NEGATIVE
Protein, ur: NEGATIVE mg/dL
Specific Gravity, Urine: 1.027 (ref 1.005–1.030)
WBC, UA: >50 WBC/hpf (ref 0–5)
pH: 6.5 (ref 5.0–8.0)

## 2024-02-12 MED ORDER — DOXYCYCLINE HYCLATE 100 MG PO TABS
100.0000 mg | ORAL_TABLET | Freq: Once | ORAL | Status: AC
  Administered 2024-02-12: 100 mg via ORAL
  Filled 2024-02-12: qty 1

## 2024-02-12 MED ORDER — STERILE WATER FOR INJECTION IJ SOLN
INTRAMUSCULAR | Status: AC
  Administered 2024-02-12: 1 mL
  Filled 2024-02-12: qty 10

## 2024-02-12 MED ORDER — CEFTRIAXONE SODIUM 500 MG IJ SOLR
500.0000 mg | Freq: Once | INTRAMUSCULAR | Status: AC
  Administered 2024-02-12: 500 mg via INTRAMUSCULAR
  Filled 2024-02-12: qty 500

## 2024-02-12 MED ORDER — DOXYCYCLINE HYCLATE 100 MG PO CAPS: 100.0000 mg | ORAL_CAPSULE | Freq: Two times a day (BID) | ORAL | 0 refills | Status: AC

## 2024-02-12 NOTE — ED Triage Notes (Signed)
 Concerned for white cloudy discharge from penis. Denies pain, denies skin changes or sores. Currently taking prophylactic antiretroviral for HIV prevention. Denies fevers chills, -N/-V/-D.

## 2024-02-12 NOTE — ED Provider Notes (Signed)
 [place] [facility] AT [facility] Provider Note   CSN: [id number] Arrival date & time: 02/12/24  0010     Patient presents with: Penile Discharge   Johnny Nelson is a 28 y.o. male.   Patient states he took his pants off this evening and noticed discharge from his penis.  Some itching to his urethra.  No pain with urination or blood in the urine.  No frequency or urgency.  No fever.  No abdominal pain, back pain, chest pain or shortness of breath.  No testicular pain.  He sees infectious disease and takes a preventative for HIV but does not have HIV.  Feels well. Did have brief episode of chest pain around 2 PM when he was in the shower it lasted for a few seconds and has since resolved.  The history is provided by the patient.  Penile Discharge Associated symptoms include chest pain. Pertinent negatives include no abdominal pain, no headaches and no shortness of breath.       Prior to Admission medications   Medication Sig Start Date End Date Taking? Authorizing Provider  cetirizine  (ZYRTEC  ALLERGY) 10 MG tablet Take 1 tablet (10 mg total) by mouth daily. 12/12/21   Christopher Savannah, PA-C    Allergies: Patient has no known allergies.    Review of Systems  Constitutional:  Negative for activity change, appetite change and fever.  HENT:  Negative for congestion and rhinorrhea.   Respiratory:  Negative for cough and shortness of breath.   Cardiovascular:  Positive for chest pain.  Gastrointestinal:  Negative for abdominal pain, nausea and vomiting.  Genitourinary:  Positive for penile discharge. Negative for dysuria.  Musculoskeletal:  Negative for arthralgias and myalgias.  Skin:  Negative for rash.  Neurological:  Negative for dizziness, weakness and headaches.   all other systems are negative except as noted in the HPI and PMH.    Updated Vital Signs BP (!) 152/100   Pulse 95   Temp 98.3 F (36.8 C) (Oral)   Resp 16   SpO2 97%   Physical  Exam Vitals and nursing note reviewed.  Constitutional:      General: He is not in acute distress.    Appearance: He is well-developed.  HENT:     Head: Normocephalic and atraumatic.     Mouth/Throat:     Pharynx: No oropharyngeal exudate.  Eyes:     Conjunctiva/sclera: Conjunctivae normal.     Pupils: Pupils are equal, round, and reactive to light.  Neck:     Comments: No meningismus. Cardiovascular:     Rate and Rhythm: Normal rate and regular rhythm.     Heart sounds: Normal heart sounds. No murmur heard. Pulmonary:     Effort: Pulmonary effort is normal. No respiratory distress.     Breath sounds: Normal breath sounds.  Abdominal:     Palpations: Abdomen is soft.     Tenderness: There is no abdominal tenderness. There is no guarding or rebound.  Genitourinary:    Comments: Testicles nontender Musculoskeletal:        General: No tenderness. Normal range of motion.     Cervical back: Normal range of motion and neck supple.  Skin:    General: Skin is warm.  Neurological:     Mental Status: He is alert and oriented to person, place, and time.     Cranial Nerves: No cranial nerve deficit.     Motor: No abnormal muscle tone.     Coordination: Coordination normal.  Comments: No ataxia on finger to nose bilaterally. No pronator drift. 5/5 strength throughout. CN 2-12 intact.Equal grip strength. Sensation intact.   Psychiatric:        Behavior: Behavior normal.     (all labs ordered are listed, but only abnormal results are displayed) Labs Reviewed  URINALYSIS, ROUTINE W REFLEX MICROSCOPIC - Abnormal; Notable for the following components:      Result Value   Hgb urine dipstick SMALL (*)    Leukocytes,Ua MODERATE (*)    Bacteria, UA RARE (*)    All other components within normal limits    EKG: None  Radiology: No results found.   Procedures   Medications Ordered in the ED  cefTRIAXone (ROCEPHIN) injection 500 mg (has no administration in time range)   doxycycline (VIBRA-TABS) tablet 100 mg (has no administration in time range)                                    Medical Decision Making Amount and/or Complexity of Data Reviewed Labs: ordered. Decision-making details documented in ED Course. Radiology: ordered and independent interpretation performed. Decision-making details documented in ED Course. ECG/medicine tests: ordered and independent interpretation performed. Decision-making details documented in ED Course.  Risk Prescription drug management.   Penile discharge.  Denies pain with urination or blood in the urine.  No abdominal pain or back pain.  No testicular pain.  Does practice homosexual intercourse.  Urinalysis consistent with pyuria.  Will send culture.  Will treat empirically for suspected gonorrhea or chlamydia.  Chest pain description atypical for ACS.  Now resolved.  No shortness of breath, cough or fever  EKG is sinus rhythm without acute ST changes.  Low concern for ACS, PE, aortic dissection.  Blood pressure improved on recheck.  Denies any chest pain or shortness of breath currently.  Will treat empirically for suspected urethritis with IM Rocephin and p.o. doxycycline.  Safe sex practices discussed.     Final diagnoses:  None    ED Discharge Orders     None          Una Yeomans, Garnette, MD 02/12/24 [phone]

## 2024-02-12 NOTE — Discharge Instructions (Signed)
 You were treated for both suspected gonorrhea and chlamydia today.  Take the antibiotic as prescribed and follow-up with your doctor.  Use a condom for every sexual intercourse encounter.  Return to the ED with new or worsening symptoms.

## 2024-02-23 ENCOUNTER — Encounter

## 2024-02-27 ENCOUNTER — Encounter: Payer: Self-pay | Admitting: *Deleted

## 2024-02-27 ENCOUNTER — Encounter: Admitting: *Deleted

## 2024-02-27 ENCOUNTER — Other Ambulatory Visit: Payer: Self-pay

## 2024-02-27 DIAGNOSIS — Z006 Encounter for examination for normal comparison and control in clinical research program: Secondary | ICD-10-CM

## 2024-02-27 NOTE — Research (Addendum)
 [name] [id number]                                                  Protocol Amendment 02  [facility] Site:  [phone]                                                                  TREATMENT PHASE  Visit: [] M6   [] M6+2W   [x] M7   [] M7+2W    [] M8   [] M8+2W   [] M9    [] M9+1W   [] M10   [] M11   [] M12   [] M13   [] M15    [] M17   [] M19   [] M21   [] M23   [] M25   [] M27   [] M29    [] M31   [] M33   [] ED  [] 4 wk followup (if not entering LTFU)   [] LTFU M1   [] LTFU M4   [] LTFU M8   [] LTFU M12     Date: 20Oct2025         4 Letter Code: MCEA Subject ID: [id number]  Assessments should occur in the following order:  eCSSRs  All other questionnaires  Triplicate 12-lead ECG  Vital signs  Blood draws (clinical laboratory tests, pre-dose pharmacokinetics, etc)  IP injection  Post-dose pharmacokinetics (if required)  Yes No Completed:  [x]  []  Verify participant identity before doing any study procedures   All Visits  [x]  []  Verify correct version of ICF is signed   All Visits   Yes No Questionnaires Completed: Patient Reported Outcome Assessment must be completed before other assessments take place at each designated visit. Completed electronically by participants.  []  [x]  eCSSRs    Visits: M9, M13, M17, M21, M25, M29, M33, ED Any positive  (abnormal) response indicating SIB or any unusual changes in behavior, confirmed by the investigator will result in their discontinuation of study intervention, the PI/SI will arrange for urgent specialist psychiatric evaluation and management  []  [x]  Social Determinants of Health Questionnaire (SDoH)     Visits: M17, ED  []  [x]  Study Medication Satisfaction Questionnaire (SMSQs)     Visits: M9, M13, M17, M21, M25, M29, M33, ED  []  [x]  PrEP Preference and Rationale Questionnaire (only if prior exposure to oral PrEP)     Visits: M9, M17, M25, M33, ED  []  [x]  Acceptability of Injection Questionnaire     Visits: M9, M13, M17, M21, M25, M29, M33, ED  []  [x]  Generalized Anxiety  Disorders (GAD-&) Questionnaire     Visits: M9, M13, M17, M21, M25, M29, M33, ED  []  [x]  HIV-risk and PrEP use Anxiety and Stigma Questionnaire prior to the study drug administration     Visits: M17, M29, M33, ED  []  [x]  Sexual Health Practices Questionnaire-administer prior to study drug administration Visits: M9, M13, M17, M21, M25, M29, M33, ED    Yes No Completed:  [x]  []  Review Changes in Medical History    All Visits  [x]  []  Review Changes in Concomitant Medications    All Visits  [x]  []  Assess for AEs, SAEs: All AEs (from Day 1 onwards) and SAEs (from Screening onwards) must be recorded in eCRF  at all visits and reported within required timelines.    All Visits  Is participant experiencing any symptomatic STIs currently?  [] Yes [x] No (if yes, perform STI testing)  Is participant experiencing any signs or symptoms consistent with acute HIV infection?  [] Yes [x] No (if yes, perform HIV testing)   [x]  []  Pull Review in:  Medication Review Medication Indication Dose/Route/Frequency Start Date End Date  cetirizine  (Zyrtec )    Seasonal Allergies 10 mg tab PO QD 05Aug2023   Tylenol  Right ventrogluteal Injection Site tenderness 1000 mg PO once daily 20MAR2025 22MAR2025  Tylenol  Left Ventrogluteal Injection Site Tenderness 1000mg  PO once daily 26APR2025 26APR2025  Lotrimin AF Cream Small Bump (approx. 0.64mm) Right Thigh, Furuncle 1 application daily  05SEP2025 18SEP2025  Triple Antibiotic Ointment Polymyxin B Sulfate/Bacitracin zinc, neomycin sulfate Small Bump (approx. 0.80mm) Right Thigh, Furuncle 1 application daily 19SEP2025 30Sep2025  Ceftriaxone Urethritis 500 mg IM Once 05Oct2025 05Oct2025  Doxycycline Urethritis 100 mg PO BID 05Oct2025 13Oct2025  TUMS (Calcium carbonate Acid Reflux 2 tablets PO PRN 18Oct2025    AE Review Diagnosis/Symptoms Indicate: NSAE SAE AESI Maximum Grade Related/Not Related to study intervention CAB LA or CAB ULA Action/Dose Change Start Date (Time, if  available) End Date (Time, if available)  Right ventrogluteal injection site tenderness NSAE  2 Related CAB LA none 20MAR2025 at 1000 22MAR2025 at 1200  Headache NSAE 1 Related CAB LA none 20MAR2025 at 1000 22MAR2025 at 1200  Left ventrogluteal injection site tenderness NSAE 2 Related to CAB LA none 23APR2025 at 2200 27APR2025 at 0900  Nausea NSAE 1 Related to CAB LA none 26APR2025 at 0900  27APR2025 at 0900  Right ventrogluteal  injection site tenderness NSAE 1 Related to ULA CAB  none 25Jun2025 at 1000 29Jun2025 at 1000  Left Ventrogluteal injection site tenderness NSAE 2 Related to CAB ULA none 22Aug2025 at 0900 25Aug2025 at 0900  Small Bump (approx. 0.2mm) Right Thigh, Furuncle NSAE 2 Not Related none 05Sep2025 30Sep2025  Urethritis NSAE 2 Not Related None 04Oct2025 08Oct2025  Worsening Acid Reflux NSAE 2 Not Related None 18Oct2025   Atypical Chest Pain NSAE 1 Not Related None 05Oct2025 05Oct2025   Medical History Review  Diagnosis Grade Start Date End Date  Stomach Ulcers 2 2021 2021  Seasonal allergies 2 2008   Dry Skin 2 05Jul2011   Acid Reflux 1 2022 17Oct2025          [x]  []  Contraception Review: Report in [facility] Visits: M6, M7, M8, M9, M11, M13, M17, M21,M25, M29, M31, M33, ED, 4WFU, LTFU Visits Male of childbearing potential: Must agree to use a highly effective method of contraception consistently and correctly, must also continue to use adequate contraception methods for at least 52 weeks after the last injection. Male condoms must be used in addition to hormonal contraception Sexual Abstinence: considered a highly effective method only if defined as refraining from heterosexual intercourse during the entire period of risk associated with the study intervention.  The reliability of sexual abstinence needs to be evaluated in relation to the duration of the study and the preferred and usual lifestyle of the participant.   Yes No/NA Completed:  []  [x]  Premature cardiovascular  disease: male participant <65 years or male participant <55 years in first degree relatives only.  Note changes since the start of the study.    Visits: M13, M21, M29     [] No Changes    []  [x]  Substance Usage: (list substance, amount used, start and stop dates) Visits: M13, M21, M29 Social History  Substance and Sexual Activity  Alcohol Use Not Currently   Comment: Started in 2017, does not normally drink alcohol regularlly, last drink 17AUG2025    Social History   Substance and Sexual Activity  Drug Use No   Comment: Prior THC, Started 2014, stopped 01Feb2025    Tobacco Use: Low Risk  (02/12/2024)   Patient History    Smoking Tobacco Use: Never    Smokeless Tobacco Use: Never    Passive Exposure: Never    Caffeine Use:  caffeine containing beverages/day  []  [x]  Triplicate 12-lead ECG     Visits: M9, M29 Perform in a semi-supine position after 5 minutes of rest.  3 individual ECG tracing should be obtained as closely as possible in succession, but no more than 2 minutes apart. The full set of triplicates should be completed in less than 6 minutes. If ECG abnormal clinically significant  then report as an AE/SAE.  Rest Start Time:  ECG #1 start time:     ECG #2:     ECG #3:   []  [x]  Vital Signs:  [] Weight, BMI (after 5 mins in a semi-supine position)    Only on Visits:  M9, M13, M17, M21, M25, M29, M33, ED, 4WFU, LTFU Visits  [] Height    Visit: M9  [] BP and Pulse (measured in a semi supine position after 5 minutes rest)     Visits: M9, M13, M17, M21, M25, M29, M33, ED, 4WFU  [] Temperature and RR:    Visits: M13, ED  Rest Start Time:   Vital Signs Start Time:     There is no height or weight on file to calculate BMI.  There were no vitals filed for this visit.   [x]  []  Brief Symptom Directed Physical Examination: Will include at a minimum, assessments of skin, lungs, CV, abdomen (liver and spleen). Any abnormalities must be noted in the CRF (e.g current medical conditions  or AE logs).     Visit: M6, M7, M8, M9, M11, M13, M17, M21, M25, M29, M33, ED, 4W FU, LTFU   Physical Exam Constitutional:      Appearance: Normal appearance.  Cardiovascular:     Rate and Rhythm: Normal rate and regular rhythm.     Heart sounds: Normal heart sounds.  Pulmonary:     Effort: Pulmonary effort is normal.     Breath sounds: Normal breath sounds.  Abdominal:     General: Abdomen is flat. Bowel sounds are normal.     Palpations: Abdomen is soft.     Comments: Complaining of Acid Reflux. States that he has had problems with acid reflux for about 3 years but that his symptoms have recently increased in frequency and that he has started to use generic TUMS prn with good relief. He is working on establishing care with a PCP but does not have an appointment until March. Agreed to monitor his acid reflux and will report any changes in symptoms.  Skin:    General: Skin is warm and dry.  Neurological:     Mental Status: He is alert and oriented to person, place, and time. Mental status is at baseline.  Psychiatric:        Mood and Affect: Mood normal.       Yes No Blood Draw Tracking:  All Visits  [x]  []  Verified previous Hgb to be >= 10 (if value <10, adjust volume per unit SOP)                          []   NA, healthy volunteer, previous Hgb results are unavailable  []  [x]  Assess Fasting Status.   Visits: M13, M33                                                      Is participant Fasting for at least 6 hours, overnight fast preferred?  []  No   []  Yes, Date/Time of last meal?   [x]  []  Blood Drawn.     Problems?  [x]  No   [] Yes, why?   Time: 1200    Total Vol: 14.5 mL   Yes No Lab Collection:  []  [x]  RAPID HIV TESTING        Visits: M9, M13, M17, M21, M25, M29, M33, ED, 4WFU  [] REACTIVE or [] NON REACTIVE  If positive/reactive participant must be withdrawn from the study even if subsequent confirmatory testing is negative. If reactive participant referred for further testing  and clinical management as per local standard of care   [x]  []  Chemistry     Visits: M6, M7,M8, M9, M11, M13, M17, M21, M25, M29, M33, ED, 4WFU, LTFU  [x]  []  Hematology     Visits:  M7, M8, M9, M11, M13, M17, M21, M25, M29, M33, ED, 4WFU, LTFU  []  [x]  Non rapid HIV immunoassay    Visits: M9, M13, M17, M21, M25, M29, M33, ED, 4WFU LTFU  []  [x]  HIV-1 RNA     Visits: M9, M13, M17, M21, M25, M29, M33, ED, 4WFU, LTFU  []  [x]  Neisseria gonorrhea (GC), Chlamydia trachomatis (CT), Trichomononas vaginalis (TV), syphilis.  If positive we will refer for treatment as per local guidelines.   Visit: M9, M13, M17, M21, M25, M29, M33, ED, 4WFU, LTFU, PRN   **PRN if symptomatic at other visits** Appropriate swabs to be collected based on reported sexual history.  [] GC/CT NAAT urine/vaginal swab [] GC and CT: Rectal Swab [] Oropharyngeal Swab [] TV: vaginal Swab   *If male and menstruating may take sample at next visit  []  [x]  Coagulation Tests     Visits: M13, M33  []  [x]  Hepatitis B&C     Visits: M13, M25, ED  []  [x]  Fasting Labs: Glucose     Visits: M13, M33  (Insulin, HbA1c only collect if withdrawal visit occurs at M13 or M33)  [x]  []  Plasma for Storage     Visits: M6, M7,M8, M9, M11, M13, M17, M21, M25, M29, ED, LTFU  []  [x]  Predose: PK Sampling  Collect < 1 hr before injection Visits: M9, M13, M17, M21, M25, M29  []  [x]  Illicit Drug Screen     Visits: M13, M21, M29, M33  []  [x]  Urinalysis: Morning specimen is preferred     Visits: M13, M33, ED, 4WFU  Time of Collection:   LMP: No LMP for male patient.  []  N/A   Yes No/NA Lab Collection, Cont.:  []  [x]  Urine Pregnancy Test (POC[facility] only)  Visits: M9, M13, M17, M21, M25, M29, M33 (resulted prior to receipt of IP), ED, 4WFU, LTFU   Time of Collection:     [] NA, assigned Male at birth or Not a POCBP  []                          [x]  Serum Pregnancy Test ([facility] only) only if urine test positive, perform a serum test to confirm.   Yes No/NA  Study Drug  Administration  []  [x]  CAB ULA Administration:   Visit: M9, M13, M17, M21, M25, M29  Sites should be rotated between right and left Needle Size: [] 1.5  [] 2 (For BMI >/= 30)   There is no height or weight on file to calculate BMI.  Gauge: [] 21G [] 22G [] 23G  Site: [] Left Ventrogluteal  [] Right Ventrogluteal  Time of Administration:    [x]  []  ISR Review completed, ISR photography necessary [] Yes[x] No     All Visits *Digital photographs will be documented at all visits (scheduled or unscheduled) on all participants who have an injection site reaction that is a visible, persistent Grade 2 (no improvement/resolution in >10 days), Grade >/=3, or serious. *Examination will include a physician/HCP assessment of pain (or tenderness), pruritus, warmth, infections, rash, erythema (or redness), swelling (or induration), and nodules (granulomas or cysts) *For injection site nodules, the largest diameter will be recorded in the eCRF at each ISR assessment for nodules of at least Grade 1 severity  *Remote Visits: M6+2W, M7+2W, M8+2W, M10, M12, M15, M19, M23, M27, M31 should assess whether participants have any new ISR symptoms/signs since their last visit or have previously reported ISR that has not improved or is worsening, if present site should perform an unscheduled visits ASAP for ISR assessment   Yes No/NA  Pharmacokinetic Sampling  [x]  []   Post Dose PK sampling: 2 hours after injection on dosing days(+/- 30 mins collection window Visits: M6, M7, M8, M9, M9+1Wk, M11, M13, M33, ED, 4WFU, LTFU     Time of Last Injection: 0938 21Aug2025        Time of PK Collection: 1200      Yes No Completed:  [x]  []   HIV/STI  Counseling/Condom Distribution:    All Visits, except remote calls [x] Accepted  [] Declined   Yes No/NA Completed  []  [x]  Was there an ISR during remote phone call visit:  [] Yes []  No If yes, schedule unscheduled ISR assessment ASAP Date:   [x]  []  Assess for Protocol Deviations  [x]  []   Reminders:  Participants of childbearing potential agree to use a highly effective method of contraception consistently and correctly HIV vaccines are not permitted at any time during the study No other experimental agents, antiretroviral drugs, cytotoxic chemotherapy, or radiation therapy may not be administered throughout the trial No systemically administered immunomodulator's permitted Notify study staff if you experience any signs/symptoms, visit provider in ER or scheduled evaluation, or start any new OTC, supplemental or prescription therapies Instructed to contact site if ISR gets worse or does not improve after 10 days from the last ISR assessment.   Yes No Completed:  [x]  []  Compensation:  You will receive a payment of $78.00 at the end of each non dosing visit completed to reimburse you for your time and effort.  You will receive $125.00 at the end of each dosing visit completed to reimburse you for your time and effort.   If you must return to the clinic for an unscheduled visit, you will receive $78.   Amount: $78.00   [x]  []  Schedule Next Visit: Visits should be planned using a calendar months and scheduled based on the date of the Day 1 injection.    Remote Call Visits schedule on M6+2 wk, M7+2wk, M8+2wk, M10, M12, M15, M19, M23, M27, M31 (Assess ISR, AES, Conmeds)  If transitioning to CAB 200mg /ml do not need LTFU visits, but do need ED visit and 4 week followup visit.  Date: 31Oct2025     Time: 0830  Version 2.3 07Oct2025 JLS

## 2024-03-01 ENCOUNTER — Inpatient Hospital Stay: Admission: RE | Admit: 2024-03-01 | Payer: PRIVATE HEALTH INSURANCE | Source: Ambulatory Visit

## 2024-03-05 NOTE — Progress Notes (Signed)
 I have reviewed the findings including labs for this participant in EXTEND study.

## 2024-03-09 ENCOUNTER — Other Ambulatory Visit: Payer: Self-pay

## 2024-03-09 DIAGNOSIS — Z006 Encounter for examination for normal comparison and control in clinical research program: Secondary | ICD-10-CM

## 2024-03-09 NOTE — Research (Signed)
 [name] [id number]                                                  Protocol Amendment 02  [facility]:  [phone]                                                                  TREATMENT PHASE  Visit: [] M6   [] M6+2W   [] M7   [x] M7+2W    [] M8   [] M8+2W   [] M9    [] M9+1W   [] M10   [] M11   [] M12   [] M13   [] M15    [] M17   [] M19   [] M21   [] M23   [] M25   [] M27   [] M29    [] M31   [] M33   [] ED  [] 4 wk followup (if not entering LTFU)   [] LTFU M1   [] LTFU M4   [] LTFU M8   [] LTFU M12     Date: 31Oct2025         4 Letter Code: [place] Subject ID: [id number]  Assessments should occur in the following order:  eCSSRs  All other questionnaires  Triplicate 12-lead ECG  Vital signs  Blood draws (clinical laboratory tests, pre-dose pharmacokinetics, etc)  IP injection  Post-dose pharmacokinetics (if required)  Yes No Completed:  [x]  []  Verify participant identity before doing any study procedures   All Visits  [x]  []  Verify correct version of ICF is signed   All Visits   Yes No Questionnaires Completed: Patient Reported Outcome Assessment must be completed before other assessments take place at each designated visit. Completed electronically by participants.  []  [x]  eCSSRs    Visits: M9, M13, M17, M21, M25, M29, M33, ED Any positive  (abnormal) response indicating SIB or any unusual changes in behavior, confirmed by the investigator will result in their discontinuation of study intervention, the PI/SI will arrange for urgent specialist psychiatric evaluation and management  []  [x]  Social Determinants of Health Questionnaire (SDoH)     Visits: M17, ED  []  [x]  Study Medication Satisfaction Questionnaire (SMSQs)     Visits: M9, M13, M17, M21, M25, M29, M33, ED  []  [x]  PrEP Preference and Rationale Questionnaire (only if prior exposure to oral PrEP)     Visits: M9, M17, M25, M33, ED  []  [x]  Acceptability of Injection Questionnaire     Visits: M9, M13, M17, M21, M25, M29, M33, ED  []  [x]  Generalized Anxiety  Disorders (GAD-&) Questionnaire     Visits: M9, M13, M17, M21, M25, M29, M33, ED  []  [x]  HIV-risk and PrEP use Anxiety and Stigma Questionnaire prior to the study drug administration     Visits: M17, M29, M33, ED  []  [x]  Sexual Health Practices Questionnaire-administer prior to study drug administration Visits: M9, M13, M17, M21, M25, M29, M33, ED    Yes No Completed:  [x]  []  Review Changes in Medical History    All Visits  [x]  []  Review Changes in Concomitant Medications    All Visits  [x]  []  Assess for AEs, SAEs: All AEs (from Day 1 onwards) and SAEs (from Screening onwards) must be recorded in eCRF  at all visits and reported within required timelines.    All Visits  Is participant experiencing any symptomatic STIs currently?  [] Yes [x] No (if yes, perform STI testing)  Is participant experiencing any signs or symptoms consistent with acute HIV infection?  [] Yes [x] No (if yes, perform HIV testing)   [x]  []  Pull Review in:  Medication Review Medication Indication Dose/Route/Frequency Start Date End Date  cetirizine  (Zyrtec )    Seasonal Allergies 10 mg tab PO QD 05Aug2023   Tylenol  Right ventrogluteal Injection Site tenderness 1000 mg PO once daily 20MAR2025 22MAR2025  Tylenol  Left Ventrogluteal Injection Site Tenderness 1000mg  PO once daily 26APR2025 26APR2025  Lotrimin AF Cream Small Bump (approx. 0.2mm) Right Thigh, Furuncle 1 application daily  05SEP2025 18SEP2025  Triple Antibiotic Ointment Polymyxin B Sulfate/Bacitracin zinc, neomycin sulfate Small Bump (approx. 0.23mm) Right Thigh, Furuncle 1 application daily 19SEP2025 30Sep2025  Ceftriaxone Urethritis 500 mg IM Once 05Oct2025 05Oct2025  Doxycycline Urethritis 100 mg PO BID 05Oct2025 13Oct2025  TUMS (Calcium carbonate Acid Reflux 2 tablets PO PRN 18Oct2025    AE Review Diagnosis/Symptoms Indicate: NSAE SAE AESI Maximum Grade Related/Not Related to study intervention CAB LA or CAB ULA Action/Dose Change Start Date (Time, if  available) End Date (Time, if available)  Right ventrogluteal injection site tenderness NSAE  2 Related CAB LA none 20MAR2025 at 1000 22MAR2025 at 1200  Headache NSAE 1 Related CAB LA none 20MAR2025 at 1000 22MAR2025 at 1200  Left ventrogluteal injection site tenderness NSAE 2 Related to CAB LA none 23APR2025 at 2200 27APR2025 at 0900  Nausea NSAE 1 Related to CAB LA none 26APR2025 at 0900  27APR2025 at 0900  Right ventrogluteal  injection site tenderness NSAE 1 Related to ULA CAB  none 25Jun2025 at 1000 29Jun2025 at 1000  Left Ventrogluteal injection site tenderness NSAE 2 Related to CAB ULA none 22Aug2025 at 0900 25Aug2025 at 0900  Small Bump (approx. 0.69mm) Right Thigh, Furuncle NSAE 2 Not Related none 05Sep2025 30Sep2025  Urethritis NSAE 2 Not Related None 04Oct2025 08Oct2025  Worsening Acid Reflux NSAE 2 Not Related None 18Oct2025   Atypical Chest Pain NSAE 1 Not Related None 05Oct2025 05Oct2025   Medical History Review  Diagnosis Grade Start Date End Date  Stomach Ulcers 2 2021 2021  Seasonal allergies 2 2008   Dry Skin 2 05Jul2011   Acid Reflux 1 2022 17Oct2025            []  [x]  Contraception Review: Report in [facility] Visits: M6, M7, M8, M9, M11, M13, M17, M21,M25, M29, M31, M33, ED, 4WFU, LTFU Visits Male of childbearing potential: Must agree to use a highly effective method of contraception consistently and correctly, must also continue to use adequate contraception methods for at least 52 weeks after the last injection. Male condoms must be used in addition to hormonal contraception Sexual Abstinence: considered a highly effective method only if defined as refraining from heterosexual intercourse during the entire period of risk associated with the study intervention.  The reliability of sexual abstinence needs to be evaluated in relation to the duration of the study and the preferred and usual lifestyle of the participant.   Yes No/NA Completed:  []  [x]  Premature  cardiovascular disease: male participant <65 years or male participant <55 years in first degree relatives only.  Note changes since the start of the study.    Visits: M13, M21, M29     [] No Changes    []  [x]  Substance Usage: (list substance, amount used, start and stop dates) Visits: M13, M21, M29  Social History   Substance and Sexual Activity  Alcohol Use Not Currently   Comment: Started in 2017, does not normally drink alcohol regularlly, last drink 17AUG2025    Social History   Substance and Sexual Activity  Drug Use No   Comment: Prior THC, Started 2014, stopped 01Feb2025    Tobacco Use: Low Risk  (02/12/2024)   Patient History    Smoking Tobacco Use: Never    Smokeless Tobacco Use: Never    Passive Exposure: Never    Caffeine Use:  caffeine containing beverages/day  []  [x]  Triplicate 12-lead ECG     Visits: M9, M29 Perform in a semi-supine position after 5 minutes of rest.  3 individual ECG tracing should be obtained as closely as possible in succession, but no more than 2 minutes apart. The full set of triplicates should be completed in less than 6 minutes. If ECG abnormal clinically significant  then report as an AE/SAE.  Rest Start Time:  ECG #1 start time:     ECG #2:     ECG #3:    []  [x]  Vital Signs:  [] Weight, BMI (after 5 mins in a semi-supine position)    Only on Visits:  M9, M13, M17, M21, M25, M29, M33, ED, 4WFU, LTFU Visits  [] Height    Visit: M9  [] BP and Pulse (measured in a semi supine position after 5 minutes rest)     Visits: M9, M13, M17, M21, M25, M29, M33, ED, 4WFU  [] Temperature and RR:    Visits: M13, ED  Rest Start Time:   Vital Signs Start Time:     There is no height or weight on file to calculate BMI.  There were no vitals filed for this visit.   []  [x]  Brief Symptom Directed Physical Examination: Will include at a minimum, assessments of skin, lungs, CV, abdomen (liver and spleen). Any abnormalities must be noted in the CRF (e.g current  medical conditions or AE logs).     Visit: M6, M7, M8, M9, M11, M13, M17, M21, M25, M29, M33, ED, 4W FU, LTFU      Yes No Blood Draw Tracking:  All Visits  []  [x]  Verified previous Hgb to be >= 10 (if value <10, adjust volume per unit SOP)                          [] NA, healthy volunteer, previous Hgb results are unavailable  []  [x]  Assess Fasting Status.   Visits: M13, M33                                                      Is participant Fasting for at least 6 hours, overnight fast preferred?  []  No   []  Yes, Date/Time of last meal?   []  [x]  Blood Drawn.     Problems?  []  No   [] Yes, why?   Time:     Total Vol:  mL   Yes No Lab Collection:  []  [x]  RAPID HIV TESTING        Visits: M9, M13, M17, M21, M25, M29, M33, ED, 4WFU  [] REACTIVE or [] NON REACTIVE  If positive/reactive participant must be withdrawn from the study even if subsequent confirmatory testing is negative. If reactive participant referred for further testing and clinical management as per local standard  of care   []  [x]  Chemistry     Visits: M6, M7,M8, M9, M11, M13, M17, M21, M25, M29, M33, ED, 4WFU, LTFU  []  [x]  Hematology     Visits:  M7, M8, M9, M11, M13, M17, M21, M25, M29, M33, ED, 4WFU, LTFU  []  [x]  Non rapid HIV immunoassay    Visits: M9, M13, M17, M21, M25, M29, M33, ED, 4WFU LTFU  []  [x]  HIV-1 RNA     Visits: M9, M13, M17, M21, M25, M29, M33, ED, 4WFU, LTFU  []  [x]  Neisseria gonorrhea (GC), Chlamydia trachomatis (CT), Trichomononas vaginalis (TV), syphilis.  If positive we will refer for treatment as per local guidelines.   Visit: M9, M13, M17, M21, M25, M29, M33, ED, 4WFU, LTFU, PRN   **PRN if symptomatic at other visits** Appropriate swabs to be collected based on reported sexual history.  [] GC/CT NAAT urine/vaginal swab [] GC and CT: Rectal Swab [] Oropharyngeal Swab [] TV: vaginal Swab   *If male and menstruating may take sample at next visit  []  [x]  Coagulation Tests     Visits: M13, M33  []  [x]   Hepatitis B&C     Visits: M13, M25, ED  []  [x]  Fasting Labs: Glucose     Visits: M13, M33  (Insulin, HbA1c only collect if withdrawal visit occurs at M13 or M33)  []  [x]  Plasma for Storage     Visits: M6, M7,M8, M9, M11, M13, M17, M21, M25, M29, ED, LTFU  []  [x]  Predose: PK Sampling  Collect < 1 hr before injection Visits: M9, M13, M17, M21, M25, M29  []  [x]  Illicit Drug Screen     Visits: M13, M21, M29, M33  []  [x]  Urinalysis: Morning specimen is preferred     Visits: M13, M33, ED, 4WFU  Time of Collection:   LMP: No LMP for male patient.  []  N/A   Yes No/NA Lab Collection, Cont.:  []  [x]  Urine Pregnancy Test (POCBP only)  Visits: M9, M13, M17, M21, M25, M29, M33 (resulted prior to receipt of IP), ED, 4WFU, LTFU   Time of Collection:     [] NA, assigned Male at birth or Not a POCBP  []                          [x]  Serum Pregnancy Test (POC[facility] only) only if urine test positive, perform a serum test to confirm.   Yes No/NA Study Drug Administration  []  [x]  CAB ULA Administration:   Visit: M9, M13, M17, M21, M25, M29  Sites should be rotated between right and left Needle Size: [] 1.5  [] 2 (For BMI >/= 30)   There is no height or weight on file to calculate BMI.  Gauge: [] 21G [] 22G [] 23G  Site: [] Left Ventrogluteal  [] Right Ventrogluteal  Time of Administration:    [x]  []  ISR Review completed, ISR photography necessary [] Yes[x] No     All Visits *Digital photographs will be documented at all visits (scheduled or unscheduled) on all participants who have an injection site reaction that is a visible, persistent Grade 2 (no improvement/resolution in >10 days), Grade >/=3, or serious. *Examination will include a physician/HCP assessment of pain (or tenderness), pruritus, warmth, infections, rash, erythema (or redness), swelling (or induration), and nodules (granulomas or cysts) *For injection site nodules, the largest diameter will be recorded in the eCRF at each ISR assessment for nodules of at  least Grade 1 severity  *Remote Visits: M6+2W, M7+2W, M8+2W, M10, M12, M15, M19, M23, M27, M31 should assess whether participants have  any new ISR symptoms/signs since their last visit or have previously reported ISR that has not improved or is worsening, if present site should perform an unscheduled visits ASAP for ISR assessment   Yes No/NA  Pharmacokinetic Sampling  []  [x]   Post Dose PK sampling: 2 hours after injection on dosing days(+/- 30 mins collection window Visits: M6, M7, M8, M9, M9+1Wk, M11, M13, M33, ED, 4WFU, LTFU   Time of PK Collection:     Yes No Completed:  []  [x]   HIV/STI  Counseling/Condom Distribution:    All Visits, except remote calls [] Accepted  [] Declined   Yes No/NA Completed  [x]  []  Was there an ISR during remote phone call visit:  [] Yes [x]  No If yes, schedule unscheduled ISR assessment ASAP Date:   [x]  []  Assess for Protocol Deviations  [x]  []  Reminders:  Participants of childbearing potential agree to use a highly effective method of contraception consistently and correctly HIV vaccines are not permitted at any time during the study No other experimental agents, antiretroviral drugs, cytotoxic chemotherapy, or radiation therapy may not be administered throughout the trial No systemically administered immunomodulator's permitted Notify study staff if you experience any signs/symptoms, visit provider in ER or scheduled evaluation, or start any new OTC, supplemental or prescription therapies Instructed to contact site if ISR gets worse or does not improve after 10 days from the last ISR assessment.   Yes No Completed:  [x]  []  Compensation:  You will receive a payment of $78.00 at the end of each non dosing visit completed to reimburse you for your time and effort.  You will receive $125.00 at the end of each dosing visit completed to reimburse you for your time and effort.   If you must return to the clinic for an unscheduled visit, you will receive $78.    Amount: $30   [x]  []  Schedule Next Visit: Visits should be planned using a calendar months and scheduled based on the date of the Day 1 injection.    Remote Call Visits schedule on M6+2 wk, M7+2wk, M8+2wk, M10, M12, M15, M19, M23, M27, M31 (Assess ISR, AES, Conmeds)  If transitioning to CAB 200mg /ml do not need LTFU visits, but do need ED visit and 4 week followup visit.  Date: 18Nov2025     Time: 0830   Version 2.3 07Oct2025 JLS

## 2024-03-27 ENCOUNTER — Other Ambulatory Visit: Payer: Self-pay

## 2024-03-27 DIAGNOSIS — Z006 Encounter for examination for normal comparison and control in clinical research program: Secondary | ICD-10-CM

## 2024-03-27 NOTE — Research (Signed)
 [facility] [id number]                                                  Protocol Amendment 03  [facility] Site:  [id number]                                                                  TREATMENT PHASE   Visit:  [] M7+2W     [x] M8    [] M8+2W   [] M9    [] M9+1W   [] M10    [] M11    [] M12    [] M13   [] M13+1W    [] M15    [] M17    [] M17+1W     [] M19    [] M21   [] M23    [] M25   [] M27    [] M29    [] M31   [] M33   [] ED    [] 4 wk followup (if not entering LTFU)   [] LTFU M1   [] LTFU M4    [] LTFU M8    [] LTFU M12     Date: 18Nov2025         4 Letter Code: [place] Subject ID: [id number]  Assessments should occur in the following order:  eCSSRs  All other questionnaires  Triplicate 12-lead ECG  Vital signs  Blood draws (clinical laboratory tests, pre-dose pharmacokinetics, etc)  IP injection  Post-dose pharmacokinetics (if required)  Yes No Completed:  [x]  []  Verify participant identity before doing any study procedures   All Visits  [x]  []  Verify correct version of ICF is signed   All Visits   Yes No Questionnaires Completed: Patient Reported Outcome Assessment must be completed before other assessments take place at each designated visit. Completed electronically by participants.  []  [x]  CSSRs    Visits: M9, M13, M17, M21, M25, M29, M33, ED Any positive (abnormal) response indicating SIB or any unusual changes in behavior, confirmed by the investigator will result in their discontinuation of study intervention, the PI/SI will arrange for urgent specialist psychiatric evaluation and management  []  [x]  Social Determinants of Health Questionnaire (SDoH)     Visits: M17, ED  []  [x]  Study Medication Satisfaction Questionnaire (SMSQs)     Visits: M9, M13, M17, M21, M25, M29, M33, ED  []  [x]  PrEP Preference and Rationale Questionnaire (only if prior exposure to oral PrEP)     Visits: M9, M17, M25, M33, ED  []  [x]  Acceptability of Injection Questionnaire     Visits: M9, M13, M17, M21, M25, M29, M33, ED  []  [x]  Generalized  Anxiety Disorders (GAD-&) Questionnaire     Visits: M9, M13, M17, M21, M25, M29, M33, ED  []  [x]  HIV-risk and PrEP use Anxiety and Stigma Questionnaire prior to the study drug administration     Visits: M17, M29, M33, ED  []  [x]  Sexual Health Practices Questionnaire-administer prior to study drug administration Visits: M9, M13, M17, M21, M25, M29, M33, ED    Yes No Completed:  [x]  []  Review Changes in Concomitant Medications    All Visits  [x]  []  Assess for AEs, SAEs: All AEs (from Day 1 onwards) and SAEs (from Screening onwards) must be recorded in  eCRF at all visits and reported within required timelines.    All Visits  Is participant experiencing any symptomatic STIs currently?  [] Yes [x] No (if yes, perform STI testing)  Is participant experiencing any signs or symptoms consistent with acute HIV infection?  [] Yes [x] No (if yes, perform HIV testing)   [x]  []  Pull Review in:  Medication Review Medication Indication Dose/Route/Frequency Start Date End Date  cetirizine  (Zyrtec )    Seasonal Allergies 10 mg tab PO QD 05Aug2023   Tylenol  Right ventrogluteal Injection Site tenderness 1000 mg PO once daily 20MAR2025 22MAR2025  Tylenol  Left Ventrogluteal Injection Site Tenderness 1000mg  PO once daily 26APR2025 26APR2025  Lotrimin AF Cream Small Bump (approx. 0.85mm) Right Thigh, Furuncle 1 application daily  05SEP2025 18SEP2025  Triple Antibiotic Ointment Polymyxin B Sulfate/Bacitracin zinc, neomycin sulfate Small Bump (approx. 0.65mm) Right Thigh, Furuncle 1 application daily 19SEP2025 30Sep2025  Ceftriaxone Urethritis 500 mg IM Once 05Oct2025 05Oct2025  Doxycycline Urethritis 100 mg PO BID 05Oct2025 13Oct2025  TUMS (Calcium carbonate Acid Reflux 2 tablets PO PRN 18Oct2025    AE Review Diagnosis/Symptoms Indicate: NSAE SAE AESI Maximum Grade Related/Not Related to study intervention CAB LA or CAB ULA Action/Dose Change Start Date (Time, if available) End Date (Time, if available)  Right  ventrogluteal injection site tenderness NSAE  2 Related CAB LA none 20MAR2025 at 1000 22MAR2025 at 1200  Headache NSAE 1 Related CAB LA none 20MAR2025 at 1000 22MAR2025 at 1200  Left ventrogluteal injection site tenderness NSAE 2 Related to CAB LA none 23APR2025 at 2200 27APR2025 at 0900  Nausea NSAE 1 Related to CAB LA none 26APR2025 at 0900  27APR2025 at 0900  Right ventrogluteal  injection site tenderness NSAE 1 Related to ULA CAB  none 25Jun2025 at 1000 29Jun2025 at 1000  Left Ventrogluteal injection site tenderness NSAE 2 Related to CAB ULA none 22Aug2025 at 0900 25Aug2025 at 0900  Small Bump (approx. 0.77mm) Right Thigh, Furuncle NSAE 2 Not Related none 05Sep2025 30Sep2025  Urethritis NSAE 2 Not Related None 04Oct2025 08Oct2025  Worsening Acid Reflux NSAE 2 Not Related None 18Oct2025   Atypical Chest Pain NSAE 1 Not Related None 05Oct2025 05Oct2025   Medical History Review  Diagnosis Grade Start Date End Date  Stomach Ulcers 2 2021 2021  Seasonal allergies 2 2008   Dry Skin 2 05Jul2011   Acid Reflux 1 2022 17Oct2025           [x]  []  Contraception Review: Report in eCRF Visits: M8, M9, M10, M11, M13, M17, M21, M25, M29, M33, ED, 4WFU, LTFU Visits Male of childbearing potential: Must agree to use a highly effective method of contraception consistently and correctly, must also continue to use adequate contraception methods for at least 52 weeks after the last injection. Male condoms must be used in addition to hormonal contraception Sexual Abstinence: considered a highly effective method only if defined as refraining from heterosexual intercourse during the entire period of risk associated with the study intervention.  The reliability of sexual abstinence needs to be evaluated in relation to the duration of the study and the preferred and usual lifestyle of the participant.   Yes No/NA Completed:  []  [x]  Review Changes in Medical History:   Visits: M13, M21, M29, M33  Premature  cardiovascular disease: male participant <65 years or male participant <55 years in first degree relatives only.  Note changes since the start of the study.    [] No Changes    Substance Usage: (list substance, amount used, start and stop dates)  Social  History   Substance and Sexual Activity  Alcohol Use Not Currently   Comment: Started in 2017, does not normally drink alcohol regularlly, last drink 17AUG2025    Social History   Substance and Sexual Activity  Drug Use No   Comment: Prior THC, Started 2014, stopped 01Feb2025    Tobacco Use: Low Risk  (02/12/2024)   Patient History    Smoking Tobacco Use: Never    Smokeless Tobacco Use: Never    Passive Exposure: Never    Caffeine Use:  caffeine containing beverages/day  []  [x]  Triplicate 12-lead ECG     Visits: M9, M29, ED Perform in a semi-supine position after 5 minutes of rest.  3 individual ECG tracing should be obtained as closely as possible in succession, but no more than 2 minutes apart. The full set of triplicates should be completed in less than 6 minutes. If ECG abnormal clinically significant  then report as an AE/SAE.  Rest Start Time:  ECG #1 start time:     ECG #2:     ECG #3:    []  [x]  Vital Signs:  [] Weight, BMI (measured in a semi supine position after 5 minutes rest)  Visits:  M9, M13, M17, M21, M25, M29, M33, ED, 4WFU, LTFU Visits  [] Height    Visit: M9  [] BP and Pulse (measured in a semi supine position after 5 minutes rest)  Visits: M9, M13, M17, M21, M25, M29, M33, ED, 4WFU,  LTFU Visits  [] Temperature and RR:    Visits: M13, ED  Rest Start Time:   Vital Signs Start Time:     There is no height or weight on file to calculate BMI.  There were no vitals filed for this visit.   [x]  []  Brief Physical Examination: Will include at a minimum, assessments of skin, lungs, cardiovascular system, abdomen (liver and spleen). Perform the symptom directed exam when there are symptoms present. In addition to the  brief physical exam. Any abnormalities must be noted in the CRF (e.g current medical conditions or AE logs).     Visit: M8, M9, M11, M13, M17, M21, M25, M29, M33, ED, 4WFU, LTFU   Physical Exam Constitutional:      Appearance: Normal appearance.  Neurological:     Mental Status: He is alert and oriented to person, place, and time. Mental status is at baseline.  Psychiatric:        Mood and Affect: Mood normal.        Behavior: Behavior normal.   Exam unremarkable, denies any new medical issues or worsening of any current medical issues.    Yes No Blood Draw Tracking:  All Visits  [x]  []  Verified previous Hgb to be >= 10 (if value <10, adjust volume per unit SOP)                          [] NA, healthy volunteer, previous Hgb results are unavailable  []  [x]  Assess Fasting Status.   Visits: M13, M33                                                      Is participant Fasting for at least 6 hours, overnight fast preferred?  []  No   []  Yes, Date/Time of last meal?   [x]  []  Blood Drawn.  Problems?  [x]  No   [] Yes, why?   Time: 0900    Total Vol: 14 mL   Yes No Lab Collection:  []  [x]  RAPID HIV TESTING        Visits: M9, M13, M17, M21, M25, M29, ED, 4WFU, LTFU  [] REACTIVE or [] NON REACTIVE  If positive/reactive participant must be withdrawn from the study even if subsequent confirmatory testing is negative. If reactive participant referred for further testing and clinical management as per local standard of care   [x]  []  Chemistry     Visits: M8, M9, M10, M11, M13, M17, M21, M25, M29, M33, ED, 4WFU, LTFU  [x]  []  Hematology     Visits:  M8, M9, M10, M11, M13, M17, M21, M25, M29, M33, ED, 4WFU, LTFU  [x]  []  Cystatin C     Visits:  M8, M9, M10, M11, M13, M17, M21, M25, M29, M33, ED, 4WFU, LTFU  []  [x]  Non rapid HIV immunoassay    Visits: M9, M13, M17, M21, M25, M29, M33, ED, 4WFU, LTFU  []  [x]  HIV-1 RNA     Visits: M9, M13, M17, M21, M25, M29, M33, ED, 4WFU, LTFU  []  [x]  Neisseria  gonorrhea (GC), Chlamydia trachomatis (CT), Trichomononas vaginalis (TV), syphilis.  If positive we will refer for treatment as per local guidelines.   Visit: M9, M13, M17, M21, M25, M29, M33, ED, 4WFU, LTFU, PRN   **PRN if symptomatic at other visits** Appropriate swabs to be collected based on reported sexual history.  [] GC/CT NAAT urine/vaginal swab [] GC and CT: Rectal Swab [] Oropharyngeal Swab [] TV: vaginal Swab   *If male and menstruating may take sample at next visit  []  [x]  Coagulation Tests     Visits: M13, M33, ED  []  [x]  Hepatitis B&C     Visits: M13, M25, ED  []  [x]  Fasting Labs: Glucose     Visits: M13, M33, ED (Insulin, HbA1c only collect if withdrawal visit occurs at M13 or M33)  [x]  []  Plasma for Storage     Visits: M8, M9, M10, M11, M13, M17, M21, M25, M29, ED  []  [x]  Predose: PK Sampling  Collect  < 1 hr before injection Visits: M9, M13, M17, M21, M25, M29  []  [x]  Illicit Drug Screen     Visits: M13, M21, M29, M33  []  [x]  Urinalysis: Morning specimen is preferred     Visits: M13, M33, ED, 4WFU   Time of Collection:   LMP: No LMP for male patient.  []  N/A   Yes No/NA Lab Collection, Cont.:  []  [x]  Urine Pregnancy Test ([facility] only)  Visits: M9, M13, M17, M21, M25, M29, M33 , ED, 4WFU, LTFU       Note: Must be resulted prior to receipt of IP  Time of Collection:     [] REACTIVE or [] NON REACTIVE  [] NA, assigned Male at birth or Not a POCBP   []                          [x]  Serum Pregnancy Test ([facility] only) if urine test positive, perform a serum test to confirm.   Yes No/NA Study Drug Administration  []  [x]  CAB ULA Administration:   Visit: M9, M13, M17, M21, M25, M29  Sites should be rotated between right and left Needle Size: [] 1.5  [] 2 (For BMI >/= 30)   There is no height or weight on file to calculate BMI.  Gauge: [] 21G [] 22G [] 23G  Site: [] Left Ventrogluteal  [] Right Ventrogluteal  Time of  Administration:    [x]  []  ISR Review completed, ISR photography  necessary [] Yes[x] No     All Visits *Digital photographs will be documented at all visits (scheduled or unscheduled) on all participants who have an injection site reaction that is a visible, persistent Grade 2 (no improvement/resolution in >10 days), Grade >/=3, or serious. *Examination will include a physician/HCP assessment of pain (or tenderness), pruritus, warmth, infections, rash, erythema (or redness), swelling (or induration), and nodules (granulomas or cysts) *For injection site nodules, the largest diameter will be recorded in the eCRF at each ISR assessment for nodules of at least Grade 1 severity  *Remote Visits: M7+2W, M8+2W, M9+1W, M12, M15, M19, M23, M27, M31 should assess whether participants have any new ISR symptoms/signs since their last visit or have previously reported ISR that has not improved or is worsening, if present site should perform an unscheduled visits ASAP for ISR assessment   Yes No/NA  Pharmacokinetic Sampling  [x]  []   Post Dose PK sampling:  Visits: M8, M9, M9+1Wk, M10, M11, M13, M13+1, M17+1, M33, ED, 4WFU, LTFU   On dosing days collect PK 2 hours after injection (+/- 30 mins collection window)  Time of PK Collection: 0900    Yes No Completed:  [x]  []   HIV/STI  Counseling/Condom Distribution:    All Visits, except remote calls Condoms: [] Accepted  [x] Declined   Yes No/NA Completed  []  [x]  Was there an ISR during remote phone call visit:  [] Yes []  No If yes, schedule unscheduled ISR assessment ASAP Date:   [x]  []  Assess for Protocol Deviations  [x]  []  Reminders:  Participants of childbearing potential agree to use a highly effective method of contraception consistently and correctly HIV vaccines are not permitted at any time during the study No other experimental agents, antiretroviral drugs, cytotoxic chemotherapy, or radiation therapy may not be administered throughout the trial No systemically administered immunomodulator's permitted Notify study  staff if you experience any signs/symptoms, visit provider in ER or scheduled evaluation, or start any new OTC, supplemental or prescription therapies Instructed to contact site if ISR gets worse or does not improve after 10 days from the last ISR assessment.   Yes No Completed:  [x]  []  Compensation:  You will receive a payment of $78.00 at the end of each non dosing visit completed to reimburse you for your time and effort.  You will receive $125.00 at the end of each dosing visit completed to reimburse you for your time and effort.   If you must return to the clinic for an unscheduled visit, you will receive $78.  Phone visits will be reimbursed at $30.00.  Amount: $78.00   [x]  []  Schedule Next Visit: Visits should be planned using a calendar months and scheduled based on the date of the Day 1 injection.    Remote Call Visits schedule on M7+2wk, M8+2wk, M12, M15, M19, M23, M27, M31 (Assess ISR, AES, Conmeds)  If transitioning to CAB 200mg /ml do not need LTFU visits, but do need ED visit and 4 week followup visit.  Date: 01Dec2025     Time: 1000   Version 3.0 LOA03 07Nov2025 JLS

## 2024-03-29 NOTE — Research (Signed)
 SABRA

## 2024-04-02 NOTE — Progress Notes (Signed)
 I have reviewed all of the pertinent labs and data from this participants visit.

## 2024-04-09 ENCOUNTER — Other Ambulatory Visit: Payer: Self-pay

## 2024-04-09 ENCOUNTER — Encounter: Payer: PRIVATE HEALTH INSURANCE | Admitting: *Deleted

## 2024-04-09 DIAGNOSIS — Z006 Encounter for examination for normal comparison and control in clinical research program: Secondary | ICD-10-CM

## 2024-04-09 NOTE — Research (Deleted)
extend

## 2024-04-09 NOTE — Research (Signed)
 Johnny Nelson [id number]                                                  Protocol Amendment 03  [facility]:  [phone]                                                                  TREATMENT PHASE   Visit:  [] M7+2W     [] M8    [x] M8+2W   [] M9    [] M9+1W   [] M10    [] M11    [] M12    [] M13   [] M13+1W    [] M15    [] M17    [] M17+1W     [] M19    [] M21   [] M23    [] M25   [] M27    [] M29    [] M31   [] M33   [] ED    [] 4 wk followup (if not entering LTFU)   [] LTFU M1   [] LTFU M4    [] LTFU M8    [] LTFU M12     Date: 01Dec2025         4 Letter Code: MCEA Subject ID: [id number]  Assessments should occur in the following order:  eCSSRs  All other questionnaires  Triplicate 12-lead ECG  Vital signs  Blood draws (clinical laboratory tests, pre-dose pharmacokinetics, etc)  IP injection  Post-dose pharmacokinetics (if required)  Yes No Completed:  []  [x]  Verify participant identity before doing any study procedures   All Visits  []  [x]  Verify correct version of ICF is signed   All Visits   Yes No Questionnaires Completed: Patient Reported Outcome Assessment must be completed before other assessments take place at each designated visit. Completed electronically by participants.  []  [x]  CSSRs    Visits: M9, M13, M17, M21, M25, M29, M33, ED Any positive (abnormal) response indicating SIB or any unusual changes in behavior, confirmed by the investigator will result in their discontinuation of study intervention, the PI/SI will arrange for urgent specialist psychiatric evaluation and management  []  [x]  Social Determinants of Health Questionnaire (SDoH)     Visits: M17, ED  []  [x]  Study Medication Satisfaction Questionnaire (SMSQs)     Visits: M9, M13, M17, M21, M25, M29, M33, ED  []  [x]  PrEP Preference and Rationale Questionnaire (only if prior exposure to oral PrEP)     Visits: M9, M17, M25, M33, ED  []  [x]  Acceptability of Injection Questionnaire     Visits: M9, M13, M17, M21, M25, M29, M33, ED  []  [x]  Generalized  Anxiety Disorders (GAD-&) Questionnaire     Visits: M9, M13, M17, M21, M25, M29, M33, ED  []  [x]  HIV-risk and PrEP use Anxiety and Stigma Questionnaire prior to the study drug administration     Visits: M17, M29, M33, ED  []  [x]  Sexual Health Practices Questionnaire-administer prior to study drug administration Visits: M9, M13, M17, M21, M25, M29, M33, ED    Yes No Completed:  [x]  []  Review Changes in Concomitant Medications    All Visits  [x]  []  Assess for AEs, SAEs: All AEs (from Day 1 onwards) and SAEs (from Screening onwards) must be recorded in  eCRF at all visits and reported within required timelines.    All Visits  Is participant experiencing any symptomatic STIs currently?  [] Yes [x] No (if yes, perform STI testing)  Is participant experiencing any signs or symptoms consistent with acute HIV infection?  [] Yes [x] No (if yes, perform HIV testing)   [x]  []  Pull Review in:  Medication Review Medication Indication Dose/Route/Frequency Start Date End Date  cetirizine  (Zyrtec )    Seasonal Allergies 10 mg tab PO QD 05Aug2023   Tylenol  Right ventrogluteal Injection Site tenderness 1000 mg PO once daily 20MAR2025 22MAR2025  Tylenol  Left Ventrogluteal Injection Site Tenderness 1000mg  PO once daily 26APR2025 26APR2025  Lotrimin AF Cream Small Bump (approx. 0.31mm) Right Thigh, Furuncle 1 application daily  05SEP2025 18SEP2025  Triple Antibiotic Ointment Polymyxin B Sulfate/Bacitracin zinc, neomycin sulfate Small Bump (approx. 0.37mm) Right Thigh, Furuncle 1 application daily 19SEP2025 30Sep2025  Ceftriaxone  Urethritis 500 mg IM Once 05Oct2025 05Oct2025  Doxycycline  Urethritis 100 mg PO BID 05Oct2025 13Oct2025  TUMS (Calcium carbonate Acid Reflux 2 tablets PO PRN 18Oct2025    AE Review Diagnosis/Symptoms Indicate: NSAE SAE AESI Maximum Grade Related/Not Related to study intervention CAB LA or CAB ULA Action/Dose Change Start Date (Time, if available) End Date (Time, if available)  Right  ventrogluteal injection site tenderness NSAE  2 Related CAB LA none 20MAR2025 at 1000 22MAR2025 at 1200  Headache NSAE 1 Related CAB LA none 20MAR2025 at 1000 22MAR2025 at 1200  Left ventrogluteal injection site tenderness NSAE 2 Related to CAB LA none 23APR2025 at 2200 27APR2025 at 0900  Nausea NSAE 1 Related to CAB LA none 26APR2025 at 0900  27APR2025 at 0900  Right ventrogluteal  injection site tenderness NSAE 1 Related to ULA CAB  none 25Jun2025 at 1000 29Jun2025 at 1000  Left Ventrogluteal injection site tenderness NSAE 2 Related to CAB ULA none 22Aug2025 at 0900 25Aug2025 at 0900  Small Bump (approx. 0.57mm) Right Thigh, Furuncle NSAE 2 Not Related none 05Sep2025 30Sep2025  Urethritis NSAE 2 Not Related None 04Oct2025 08Oct2025  Worsening Acid Reflux NSAE 2 Not Related None 18Oct2025   Atypical Chest Pain NSAE 1 Not Related None 05Oct2025 05Oct2025   Medical History Review  Diagnosis Grade Start Date End Date  Stomach Ulcers 2 2021 2021  Seasonal allergies 2 2008   Dry Skin 2 05Jul2011   Acid Reflux 1 2022 17Oct2025           []  [x]  Contraception Review: Report in eCRF Visits: M8, M9, M10, M11, M13, M17, M21, M25, M29, M33, ED, 4WFU, LTFU Visits Male of childbearing potential: Must agree to use a highly effective method of contraception consistently and correctly, must also continue to use adequate contraception methods for at least 52 weeks after the last injection. Male condoms must be used in addition to hormonal contraception Sexual Abstinence: considered a highly effective method only if defined as refraining from heterosexual intercourse during the entire period of risk associated with the study intervention.  The reliability of sexual abstinence needs to be evaluated in relation to the duration of the study and the preferred and usual lifestyle of the participant.   Yes No/NA Completed:  []  [x]  Review Changes in Medical History:   Visits: M13, M21, M29, M33  Premature  cardiovascular disease: male participant <65 years or male participant <55 years in first degree relatives only.  Note changes since the start of the study.    [] No Changes    Substance Usage: (list substance, amount used, start and stop dates)  Social  History   Substance and Sexual Activity  Alcohol Use Not Currently   Comment: Started in 2017, does not normally drink alcohol regularlly, last drink 17AUG2025    Social History   Substance and Sexual Activity  Drug Use No   Comment: Prior THC, Started 2014, stopped 01Feb2025    Tobacco Use: Low Risk  (02/12/2024)   Patient History    Smoking Tobacco Use: Never    Smokeless Tobacco Use: Never    Passive Exposure: Never    Caffeine Use:  caffeine containing beverages/day  []  [x]  Triplicate 12-lead ECG     Visits: M9, M29, ED Perform in a semi-supine position after 5 minutes of rest.  3 individual ECG tracing should be obtained as closely as possible in succession, but no more than 2 minutes apart. The full set of triplicates should be completed in less than 6 minutes. If ECG abnormal clinically significant  then report as an AE/SAE.  Rest Start Time:  ECG #1 start time:     ECG #2:     ECG #3:    []  [x]  Vital Signs:  [] Weight, BMI (measured in a semi supine position after 5 minutes rest)  Visits:  M9, M13, M17, M21, M25, M29, M33, ED, 4WFU, LTFU Visits  [] Height    Visit: M9  [] BP and Pulse (measured in a semi supine position after 5 minutes rest)  Visits: M9, M13, M17, M21, M25, M29, M33, ED, 4WFU,  LTFU Visits  [] Temperature and RR:    Visits: M13, ED  Rest Start Time:   Vital Signs Start Time:     There is no height or weight on file to calculate BMI.  There were no vitals filed for this visit.   []  [x]  Brief Physical Examination: Will include at a minimum, assessments of skin, lungs, cardiovascular system, abdomen (liver and spleen). Perform the symptom directed exam when there are symptoms present. In addition to the  brief physical exam. Any abnormalities must be noted in the CRF (e.g current medical conditions or AE logs).     Visit: M8, M9, M11, M13, M17, M21, M25, M29, M33, ED, 4WFU, LTFU   Physical Exam    Yes No Blood Draw Tracking:  All Visits  []  [x]  Verified previous Hgb to be >= 10 (if value <10, adjust volume per unit SOP)                          [] NA, healthy volunteer, previous Hgb results are unavailable  []  [x]  Assess Fasting Status.   Visits: M13, M33                                                      Is participant Fasting for at least 6 hours, overnight fast preferred?  []  No   []  Yes, Date/Time of last meal?  []  [x]  Blood Drawn.     Problems?  []  No   [] Yes, why?   Time:     Total Vol:  mL   Yes No Lab Collection:  []  [x]  RAPID HIV TESTING        Visits: M9, M13, M17, M21, M25, M29, ED, 4WFU, LTFU  [] REACTIVE or [] NON REACTIVE  If positive/reactive participant must be withdrawn from the study even if subsequent confirmatory testing is negative. If  reactive participant referred for further testing and clinical management as per local standard of care   []  [x]  Chemistry     Visits: M8, M9, M10, M11, M13, M17, M21, M25, M29, M33, ED, 4WFU, LTFU  []  [x]  Hematology     Visits:  M8, M9, M10, M11, M13, M17, M21, M25, M29, M33, ED, 4WFU, LTFU  []  [x]  Cystatin C     Visits:  M8, M9, M10, M11, M13, M17, M21, M25, M29, M33, ED, 4WFU, LTFU  []  [x]  Non rapid HIV immunoassay    Visits: M9, M13, M17, M21, M25, M29, M33, ED, 4WFU, LTFU  []  [x]  HIV-1 RNA     Visits: M9, M13, M17, M21, M25, M29, M33, ED, 4WFU, LTFU  []  [x]  Neisseria gonorrhea (GC), Chlamydia trachomatis (CT), Trichomononas vaginalis (TV), syphilis.  If positive we will refer for treatment as per local guidelines.   Visit: M9, M13, M17, M21, M25, M29, M33, ED, 4WFU, LTFU, PRN   **PRN if symptomatic at other visits** Appropriate swabs to be collected based on reported sexual history.  [] GC/CT NAAT urine/vaginal swab [] GC and  CT: Rectal Swab [] Oropharyngeal Swab [] TV: vaginal Swab   *If male and menstruating may take sample at next visit  []  [x]  Coagulation Tests     Visits: M13, M33, ED  []  [x]  Hepatitis B&C     Visits: M13, M25, ED  []  [x]  Fasting Labs: Glucose     Visits: M13, M33, ED (Insulin, HbA1c only collect if withdrawal visit occurs at M13 or M33)  []  [x]  Plasma for Storage     Visits: M8, M9, M10, M11, M13, M17, M21, M25, M29, ED  []  [x]  Predose: PK Sampling  Collect  < 1 hr before injection Visits: M9, M13, M17, M21, M25, M29  []  [x]  Illicit Drug Screen     Visits: M13, M21, M29, M33  []  [x]  Urinalysis: Morning specimen is preferred     Visits: M13, M33, ED, 4WFU   Time of Collection:   LMP: No LMP for male patient.  []  N/A   Yes No/NA Lab Collection, Cont.:  []  [x]  Urine Pregnancy Test (POC[facility] only)  Visits: M9, M13, M17, M21, M25, M29, M33 , ED, 4WFU, LTFU       Note: Must be resulted prior to receipt of IP  Time of Collection:     [] REACTIVE or [] NON REACTIVE  [] NA, assigned Male at birth or Not a POCBP   []                          [x]  Serum Pregnancy Test ([facility] only) if urine test positive, perform a serum test to confirm.   Yes No/NA Study Drug Administration  []  [x]  CAB ULA Administration:   Visit: M9, M13, M17, M21, M25, M29  Sites should be rotated between right and left Needle Size: [] 1.5  [] 2 (For BMI >/= 30)   There is no height or weight on file to calculate BMI.  Gauge: [] 21G   Site: [] Left Ventrogluteal  [] Right Ventrogluteal  Time of Administration:    [x]  []  ISR Review completed, ISR photography necessary [] Yes[x] No     All Visits *Digital photographs will be documented at all visits (scheduled or unscheduled) on all participants who have an injection site reaction that is a visible, persistent Grade 2 (no improvement/resolution in >10 days), Grade >/=3, or serious. *Examination will include a physician/HCP assessment of pain (or tenderness), pruritus, warmth,  infections, rash, erythema (or  redness), swelling (or induration), and nodules (granulomas or cysts) *For injection site nodules, the largest diameter will be recorded in the eCRF at each ISR assessment for nodules of at least Grade 1 severity  *Remote Visits: M7+2W, M8+2W, M9+1W, M12, M15, M19, M23, M27, M31 should assess whether participants have any new ISR symptoms/signs since their last visit or have previously reported ISR that has not improved or is worsening, if present site should perform an unscheduled visits ASAP for ISR assessment   Yes No/NA  Pharmacokinetic Sampling  []  [x]   Post Dose PK sampling:  Visits: M8, M9, M9+1Wk, M10, M11, M13, M13+1, M17+1, M33, ED, 4WFU, LTFU   On dosing days collect PK 2 hours after injection (+/- 30 mins collection window)  Time of PK Collection:     Yes No Completed:  []  [x]   HIV/STI  Counseling/Condom Distribution:    All Visits, except remote calls Condoms: [] Accepted  [] Declined   Yes No/NA Completed  [x]  []  Was there an ISR during remote phone call visit:  [] Yes [x]  No If yes, schedule unscheduled ISR assessment ASAP Date:   [x]  []  Assess for Protocol Deviations  [x]  []  Reminders:  Participants of childbearing potential agree to use a highly effective method of contraception consistently and correctly HIV vaccines are not permitted at any time during the study No other experimental agents, antiretroviral drugs, cytotoxic chemotherapy, or radiation therapy may not be administered throughout the trial No systemically administered immunomodulator's permitted Notify study staff if you experience any signs/symptoms, visit provider in ER or scheduled evaluation, or start any new OTC, supplemental or prescription therapies Instructed to contact site if ISR gets worse or does not improve after 10 days from the last ISR assessment.   Yes No Completed:  [x]  []  Compensation:  You will receive a payment of $78.00 at the end of each non dosing  visit completed to reimburse you for your time and effort.  You will receive $125.00 at the end of each dosing visit completed to reimburse you for your time and effort.   If you must return to the clinic for an unscheduled visit, you will receive $78.  Phone visits will be reimbursed at $30.00.  Amount: $30   [x]  []  Schedule Next Visit: Visits should be planned using a calendar months and scheduled based on the date of the Day 1 injection.    Remote Call Visits schedule on M7+2wk, M8+2wk, M12, M15, M19, M23, M27, M31 (Assess ISR, AES, Conmeds)  If transitioning to CAB 200mg /ml do not need LTFU visits, but do need ED visit and 4 week followup visit.  Date: 18Dec2025     Time: 0900   Version 3.1 LOA03 24Nov2025 JLS

## 2024-04-10 NOTE — Progress Notes (Signed)
 I have reviewed the data from this research participant's study visit including laboratory data for this EXTEND [id number] study.

## 2024-04-24 ENCOUNTER — Other Ambulatory Visit: Payer: Self-pay

## 2024-04-24 VITALS — BP 129/77 | HR 71 | Ht 74.8 in | Wt 157.8 lb

## 2024-04-24 DIAGNOSIS — Z006 Encounter for examination for normal comparison and control in clinical research program: Secondary | ICD-10-CM

## 2024-04-24 MED ORDER — STUDY - EXTEND - CABOTEGRAVIR ULA (GSK1265744) 1600 MG IM INJECTION (PI-VAN DAM)
1600.0000 mg | INJECTION | INTRAMUSCULAR | Status: AC
  Administered 2024-04-24: 10:00:00 1600 mg via INTRAMUSCULAR
  Filled 2024-04-24: qty 3

## 2024-04-24 NOTE — Research (Signed)
 [name] [id number]                                                  Protocol Amendment 03  [facility]:  [phone]                                                                  TREATMENT PHASE   Visit:  [] M7+2W     [] M8    [] M8+2W   [x] M9    [] M9+1W   [] M10    [] M11    [] M12    [] M13   [] M13+1W    [] M15    [] M17    [] M17+1W     [] M19    [] M21   [] M23    [] M25   [] M27    [] M29    [] M31   [] M33   [] ED    [] 4 wk followup (if not entering LTFU)   [] LTFU M1   [] LTFU M4    [] LTFU M8    [] LTFU M12     Date: 16DEC2025         4 Letter Code: MCEA Subject ID: [id number]  Assessments should occur in the following order:  eCSSRs  All other questionnaires  Triplicate 12-lead ECG  Vital signs  Blood draws (clinical laboratory tests, pre-dose pharmacokinetics, etc)  IP injection  Post-dose pharmacokinetics (if required)  Yes No Completed:  [x]  []  Verify participant identity before doing any study procedures   All Visits  [x]  []  Verify correct version of ICF is signed   All Visits   Yes No Questionnaires Completed: Patient Reported Outcome Assessment must be completed before other assessments take place at each designated visit. Completed electronically by participants.  [x]  []  CSSRs    Visits: M9, M13, M17, M21, M25, M29, M33, ED Any positive (abnormal) response indicating SIB or any unusual changes in behavior, confirmed by the investigator will result in their discontinuation of study intervention, the PI/SI will arrange for urgent specialist psychiatric evaluation and management  []  [x]  Social Determinants of Health Questionnaire (SDoH)     Visits: M17, ED  [x]  []  Study Medication Satisfaction Questionnaire (SMSQs)     Visits: M9, M13, M17, M21, M25, M29, M33, ED  [x]  []  PrEP Preference and Rationale Questionnaire (only if prior exposure to oral PrEP)     Visits: M9, M17, M25, M33, ED  [x]  []  Acceptability of Injection Questionnaire     Visits: M9, M13, M17, M21, M25, M29, M33, ED  [x]  []  Generalized  Anxiety Disorders (GAD-&) Questionnaire     Visits: M9, M13, M17, M21, M25, M29, M33, ED  []  [x]  HIV-risk and PrEP use Anxiety and Stigma Questionnaire prior to the study drug administration     Visits: M17, M29, M33, ED  [x]  []  Sexual Health Practices Questionnaire-administer prior to study drug administration Visits: M9, M13, M17, M21, M25, M29, M33, ED    Yes No Completed:  [x]  []  Review Changes in Concomitant Medications    All Visits  [x]  []  Assess for AEs, SAEs: All AEs (from Day 1 onwards) and SAEs (from Screening onwards) must be recorded in  eCRF at all visits and reported within required timelines.    All Visits  Is participant experiencing any symptomatic STIs currently?  [] Yes [x] No (if yes, perform STI testing)  Is participant experiencing any signs or symptoms consistent with acute HIV infection?  [] Yes [x] No (if yes, perform HIV testing)   [x]  []  Pull Review in:  Study: [id number] Subj ID: [id number] 4 Letter Code: MCEA  Medication Review Medication Indication Dose/Route/Frequency Start Date End Date  cetirizine  (Zyrtec )    Seasonal Allergies 10 mg tab PO QD 05Aug2023   Tylenol  Right ventrogluteal Injection Site tenderness 1000 mg PO once daily 20MAR2025 22MAR2025  Tylenol  Left Ventrogluteal Injection Site Tenderness 1000mg  PO once daily 26APR2025 26APR2025  Lotrimin AF Cream Small Bump (approx. 0.58mm) Right Thigh, Furuncle 1 application daily  05SEP2025 18SEP2025  Triple Antibiotic Ointment Polymyxin B Sulfate/Bacitracin zinc, neomycin sulfate Small Bump (approx. 0.46mm) Right Thigh, Furuncle 1 application daily 19SEP2025 30Sep2025  Ceftriaxone  Urethritis 500 mg IM Once 05Oct2025 05Oct2025  Doxycycline  Urethritis 100 mg PO BID 05Oct2025 13Oct2025  TUMS (Calcium carbonate Acid Reflux 2 tablets PO PRN 18Oct2025    AE Review Diagnosis/Symptoms Indicate: NSAE SAE AESI Maximum Grade Related/Not Related to study intervention CAB LA or CAB ULA Action/Dose Change Start  Date (Time, if available) End Date (Time, if available)  Right ventrogluteal injection site tenderness NSAE  2 Related CAB LA none 20MAR2025 at 1000 22MAR2025 at 1200  Headache NSAE 1 Related CAB LA none 20MAR2025 at 1000 22MAR2025 at 1200  Left ventrogluteal injection site tenderness NSAE 2 Related to CAB LA none 23APR2025 at 2200 27APR2025 at 0900  Nausea NSAE 1 Related to CAB LA none 26APR2025 at 0900  27APR2025 at 0900  Right ventrogluteal  injection site tenderness NSAE 1 Related to ULA CAB  none 25Jun2025 at 1000 29Jun2025 at 1000  Left Ventrogluteal injection site tenderness NSAE 2 Related to CAB ULA none 22Aug2025 at 0900 25Aug2025 at 0900  Small Bump (approx. 0.70mm) Right Thigh, Furuncle NSAE 2 Not Related none 05Sep2025 30Sep2025  Urethritis NSAE 2 Not Related None 04Oct2025 08Oct2025  Worsening Acid Reflux NSAE 2 Not Related None 18Oct2025   Atypical Chest Pain NSAE 1 Not Related None 05Oct2025 05Oct2025   Medical History Review  Diagnosis Grade Start Date End Date  Stomach Ulcers 2 2021 2021  Seasonal allergies 2 2008   Dry Skin 2 05Jul2011   Acid Reflux 1 2022 17Oct2025            [x]  []  Contraception Review: Report in eCRF Visits: M8, M9, M10, M11, M13, M17, M21, M25, M29, M33, ED, 4WFU, LTFU Visits Male of childbearing potential: Must agree to use a highly effective method of contraception consistently and correctly, must also continue to use adequate contraception methods for at least 52 weeks after the last injection. Male condoms must be used in addition to hormonal contraception Sexual Abstinence: considered a highly effective method only if defined as refraining from heterosexual intercourse during the entire period of risk associated with the study intervention.  The reliability of sexual abstinence needs to be evaluated in relation to the duration of the study and the preferred and usual lifestyle of the participant.   Yes No/NA Completed:  []  [x]  Review  Changes in Medical History:   Visits: M13, M21, M29, M33  Premature cardiovascular disease: male participant <65 years or male participant <55 years in first degree relatives only.  Note changes since the start of the study.    [] No Changes    Substance  Usage: (list substance, amount used, start and stop dates)  Social History   Substance and Sexual Activity  Alcohol Use Not Currently   Comment: Started in 2017, does not normally drink alcohol regularlly, last drink 17AUG2025    Social History   Substance and Sexual Activity  Drug Use No   Comment: Prior THC, Started 2014, stopped 01Feb2025    Tobacco Use: Low Risk (04/24/2024)   Patient History    Smoking Tobacco Use: Never    Smokeless Tobacco Use: Never    Passive Exposure: Never    Caffeine Use:  caffeine containing beverages/day  [x]  []  Triplicate 12-lead ECG     Visits: M9, M29, ED Perform in a semi-supine position after 5 minutes of rest.  3 individual ECG tracing should be obtained as closely as possible in succession, but no more than 2 minutes apart. The full set of triplicates should be completed in less than 6 minutes. If ECG abnormal clinically significant  then report as an AE/SAE.  Rest Start Time: 0852 ECG #1 start time: 0902    ECG #2: 0903    ECG #3: 0904   [x]  []  Vital Signs:  [x] Weight, BMI (measured in a semi supine position after 5 minutes rest)  Visits:  M9, M13, M17, M21, M25, M29, M33, ED, 4WFU, LTFU Visits  [x] Height    Visit: M9 190cm  [x] BP and Pulse (measured in a semi supine position after 5 minutes rest)  Visits: M9, M13, M17, M21, M25, M29, M33, ED, 4WFU,  LTFU Visits  [] Temperature and RR:    Visits: M13, ED  Rest Start Time: 9095  Vital Signs Start Time: 0909  Last Weight  Most recent update: 04/24/2024  9:13 AM    Weight  71.6 kg (157 lb 13.6 oz)             Body mass index is 19.83 kg/m.  Vitals:   04/24/24 0909  BP: 129/77  Pulse: 71     [x]  []  Brief Physical Examination:  Will include at a minimum, assessments of skin, lungs, cardiovascular system, abdomen (liver and spleen). Perform the symptom directed exam when there are symptoms present. In addition to the brief physical exam. Any abnormalities must be noted in the CRF (e.g current medical conditions or AE logs).     Visit: M8, M9, M11, M13, M17, M21, M25, M29, M33, ED, 4WFU, LTFU   Physical Exam Constitutional:      Appearance: Normal appearance. He is normal weight.  Cardiovascular:     Rate and Rhythm: Normal rate.     Pulses: Normal pulses.     Heart sounds: Normal heart sounds.  Pulmonary:     Effort: Pulmonary effort is normal.     Breath sounds: Normal breath sounds.  Abdominal:     General: Abdomen is flat. Bowel sounds are normal. There is no distension.     Palpations: Abdomen is soft.     Tenderness: There is no abdominal tenderness.  Skin:    General: Skin is warm and dry.  Neurological:     General: No focal deficit present.     Mental Status: He is alert and oriented to person, place, and time. Mental status is at baseline.  Psychiatric:        Mood and Affect: Mood normal.        Behavior: Behavior normal.        Thought Content: Thought content normal.        Judgment: Judgment normal.  Yes No Blood Draw Tracking:  All Visits  [x]  []  Verified previous Hgb to be >= 10 (if value <10, adjust volume per unit SOP)                          [] NA, healthy volunteer, previous Hgb results are unavailable  []  [x]  Assess Fasting Status.   Visits: M13, M33                                                      Is participant Fasting for at least 6 hours, overnight fast preferred?  [x]  No   []  Yes, Date/Time of last meal?   [x]  []  Blood Drawn.     Problems?  []  No   [x] Yes, why? Stuck three times  Time: 0944    Total Vol: 36 mL   Yes No Lab Collection:  [x]  []  RAPID HIV TESTING        Visits: M9, M13, M17, M21, M25, M29, ED, 4WFU, LTFU  [] REACTIVE or [x] NON REACTIVE  If  positive/reactive participant must be withdrawn from the study even if subsequent confirmatory testing is negative. If reactive participant referred for further testing and clinical management as per local standard of care   [x]  []  Chemistry     Visits: M8, M9, M10, M11, M13, M17, M21, M25, M29, M33, ED, 4WFU, LTFU  [x]  []  Hematology     Visits:  M8, M9, M10, M11, M13, M17, M21, M25, M29, M33, ED, 4WFU, LTFU  []  [x]  Cystatin C     Visits:  M8, M9, M10, M11, M13, M17, M21, M25, M29, M33, ED, 4WFU, LTFU New kits not available, not collected.  [x]  []  Non rapid HIV immunoassay    Visits: M9, M13, M17, M21, M25, M29, M33, ED, 4WFU, LTFU  [x]  []  HIV-1 RNA     Visits: M9, M13, M17, M21, M25, M29, M33, ED, 4WFU, LTFU  [x]  []  Neisseria gonorrhea (GC), Chlamydia trachomatis (CT), Trichomononas vaginalis (TV), syphilis.  If positive we will refer for treatment as per local guidelines.   Visit: M9, M13, M17, M21, M25, M29, M33, ED, 4WFU, LTFU, PRN   **PRN if symptomatic at other visits** Appropriate swabs to be collected based on reported sexual history.  [x] GC/CT NAAT urine/vaginal swab [x] GC and CT: Rectal Swab [x] Oropharyngeal Swab [] TV: vaginal Swab   *If male and menstruating may take sample at next visit  []  [x]  Coagulation Tests     Visits: M13, M33, ED  []  [x]  Hepatitis B&C     Visits: M13, M25, ED  []  [x]  Fasting Labs: Glucose     Visits: M13, M33, ED (Insulin, HbA1c only collect if withdrawal visit occurs at M13 or M33)  [x]  []  Plasma for Storage     Visits: M8, M9, M10, M11, M13, M17, M21, M25, M29, ED  [x]  []  Predose: PK Sampling  Collect  < 1 hr before injection Visits: M9, M13, M17, M21, M25, M29  []  [x]  Illicit Drug Screen     Visits: M13, M21, M29, M33  []  [x]  Urinalysis: Morning specimen is preferred     Visits: M13, M33, ED, 4WFU   Time of Collection:   LMP: No LMP for male patient.  []  N/A   Yes No/NA Lab Collection, Cont.:  []  [x]  Urine Pregnancy Test (  POC[facility] only)  Visits:  M9, M13, M17, M21, M25, M29, M33 , ED, 4WFU, LTFU       Note: Must be resulted prior to receipt of IP  Time of Collection:     [] REACTIVE or [] NON REACTIVE  [x] NA, assigned Male at birth or Not a POCBP   []                          [x]  Serum Pregnancy Test (POC[facility] only) if urine test positive, perform a serum test to confirm.   Yes No/NA Study Drug Administration  [x]  []  CAB ULA Administration:   Visit: M9, M13, M17, M21, M25, M29  Sites should be rotated between right and left Needle Size: [x] 1.5  [] 2 (For BMI >/= 30)   Body mass index is 19.83 kg/m.  Gauge: [x] 21G   Site: [] Left Ventrogluteal  [x] Right Ventrogluteal  Time of Administration: 0953   [x]  []  ISR Review completed, ISR photography necessary [] Yes[x] No     All Visits *Digital photographs will be documented at all visits (scheduled or unscheduled) on all participants who have an injection site reaction that is a visible, persistent Grade 2 (no improvement/resolution in >10 days), Grade >/=3, or serious. *Examination will include a physician/HCP assessment of pain (or tenderness), pruritus, warmth, infections, rash, erythema (or redness), swelling (or induration), and nodules (granulomas or cysts) *For injection site nodules, the largest diameter will be recorded in the eCRF at each ISR assessment for nodules of at least Grade 1 severity  *Remote Visits: M7+2W, M8+2W, M9+1W, M12, M15, M19, M23, M27, M31 should assess whether participants have any new ISR symptoms/signs since their last visit or have previously reported ISR that has not improved or is worsening, if present site should perform an unscheduled visits ASAP for ISR assessment   Yes No/NA  Pharmacokinetic Sampling  [x]  []   Post Dose PK sampling:  Visits: M8, M9, M9+1Wk, M10, M11, M13, M13+1, M17+1, M33, ED, 4WFU, LTFU   On dosing days collect PK 2 hours after injection (+/- 30 mins collection window)  Time of PK Collection: 1130    Yes No Completed:  [x]  []    HIV/STI  Counseling/Condom Distribution:    All Visits, except remote calls Condoms: [x] Accepted  [] Declined   Yes No/NA Completed  []  [x]  Was there an ISR during remote phone call visit:  [] Yes [x]  No If yes, schedule unscheduled ISR assessment ASAP Date:   [x]  []  Assess for Protocol Deviations  [x]  []  Reminders:  Participants of childbearing potential agree to use a highly effective method of contraception consistently and correctly HIV vaccines are not permitted at any time during the study No other experimental agents, antiretroviral drugs, cytotoxic chemotherapy, or radiation therapy may not be administered throughout the trial No systemically administered immunomodulator's permitted Notify study staff if you experience any signs/symptoms, visit provider in ER or scheduled evaluation, or start any new OTC, supplemental or prescription therapies Instructed to contact site if ISR gets worse or does not improve after 10 days from the last ISR assessment.   Yes No Completed:  [x]  []  Compensation:  You will receive a payment of $78.00 at the end of each non dosing visit completed to reimburse you for your time and effort.  You will receive $125.00 at the end of each dosing visit completed to reimburse you for your time and effort.   If you must return to the clinic for an unscheduled visit, you will receive $78.  Phone  visits will be reimbursed at $30.00.  Amount: $125.00   [x]  []  Schedule Next Visit: Visits should be planned using a calendar months and scheduled based on the date of the Day 1 injection.    Remote Call Visits schedule on M7+2wk, M8+2wk, M12, M15, M19, M23, M27, M31 (Assess ISR, AES, Conmeds)  If transitioning to CAB 200mg /ml do not need LTFU visits, but do need ED visit and 4 week followup visit.  Date: 22DEC2025     Time: 0830   Version 3.1 LOA03 24Nov2025 JLS

## 2024-04-30 ENCOUNTER — Other Ambulatory Visit: Payer: Self-pay

## 2024-04-30 DIAGNOSIS — Z006 Encounter for examination for normal comparison and control in clinical research program: Secondary | ICD-10-CM

## 2024-04-30 NOTE — Research (Addendum)
 [name] [id number]                                                  Protocol Amendment 03  [facility]:  [phone]                                                                  TREATMENT PHASE   Visit:  [] M7+2W     [] M8    [] M8+2W   [] M9    [x] M9+1W   [] M10    [] M11    [] M12    [] M13   [] M13+1W    [] M15    [] M17    [] M17+1W     [] M19    [] M21   [] M23    [] M25   [] M27    [] M29    [] M31   [] M33   [] ED    [] 4 wk followup (if not entering LTFU)   [] LTFU M1   [] LTFU M4    [] LTFU M8    [] LTFU M12     Date: 22DEC2025         4 Letter Code: MCEA Subject ID: [id number]  Assessments should occur in the following order:  eCSSRs  All other questionnaires  Triplicate 12-lead ECG  Vital signs  Blood draws (clinical laboratory tests, pre-dose pharmacokinetics, etc)  IP injection  Post-dose pharmacokinetics (if required)  Yes No Completed:  [x]  []  Verify participant identity before doing any study procedures   All Visits  [x]  []  Verify correct version of ICF is signed   All Visits   Yes No Questionnaires Completed: Patient Reported Outcome Assessment must be completed before other assessments take place at each designated visit. Completed electronically by participants.  []  [x]  CSSRs    Visits: M9, M13, M17, M21, M25, M29, M33, ED Any positive (abnormal) response indicating SIB or any unusual changes in behavior, confirmed by the investigator will result in their discontinuation of study intervention, the PI/SI will arrange for urgent specialist psychiatric evaluation and management  []  [x]  Social Determinants of Health Questionnaire (SDoH)     Visits: M17, ED  []  [x]  Study Medication Satisfaction Questionnaire (SMSQs)     Visits: M9, M13, M17, M21, M25, M29, M33, ED  []  [x]  PrEP Preference and Rationale Questionnaire (only if prior exposure to oral PrEP)     Visits: M9, M17, M25, M33, ED  []  [x]  Acceptability of Injection Questionnaire     Visits: M9, M13, M17, M21, M25, M29, M33, ED  []  [x]  Generalized  Anxiety Disorders (GAD-&) Questionnaire     Visits: M9, M13, M17, M21, M25, M29, M33, ED  []  [x]  HIV-risk and PrEP use Anxiety and Stigma Questionnaire prior to the study drug administration     Visits: M17, M29, M33, ED  []  [x]  Sexual Health Practices Questionnaire-administer prior to study drug administration Visits: M9, M13, M17, M21, M25, M29, M33, ED    Yes No Completed:  [x]  []  Review Changes in Concomitant Medications    All Visits  [x]  []  Assess for AEs, SAEs: All AEs (from Day 1 onwards) and SAEs (from Screening onwards) must be recorded in  eCRF at all visits and reported within required timelines.    All Visits  Is participant experiencing any symptomatic STIs currently?  [] Yes [x] No (if yes, perform STI testing)  Is participant experiencing any signs or symptoms consistent with acute HIV infection?  [] Yes [x] No (if yes, perform HIV testing)   [x]  []  Pull Review in:  Study: [id number] Subj ID: [id number] 4 Letter Code: MCEA  Medication Review Medication Indication Dose/Route/Frequency Start Date End Date  cetirizine  (Zyrtec )    Seasonal Allergies 10 mg tab PO QD 05Aug2023   Tylenol  Right ventrogluteal Injection Site tenderness 1000 mg PO once daily 20MAR2025 22MAR2025  Tylenol  Left Ventrogluteal Injection Site Tenderness 1000mg  PO once daily 26APR2025 26APR2025  Lotrimin AF Cream Small Bump (approx. 0.27mm) Right Thigh, Furuncle 1 application daily  05SEP2025 18SEP2025  Triple Antibiotic Ointment Polymyxin B Sulfate/Bacitracin zinc, neomycin sulfate Small Bump (approx. 0.81mm) Right Thigh, Furuncle 1 application daily 19SEP2025 30Sep2025  Ceftriaxone  Urethritis 500 mg IM Once 05Oct2025 05Oct2025  Doxycycline  Urethritis 100 mg PO BID 05Oct2025 13Oct2025  TUMS (Calcium carbonate Acid Reflux 2 tablets PO PRN 18Oct2025    AE Review Diagnosis/Symptoms Indicate: NSAE SAE AESI Maximum Grade Related/Not Related to study intervention CAB LA or CAB ULA Action/Dose Change Start  Date (Time, if available) End Date (Time, if available)  Right ventrogluteal injection site tenderness NSAE  2 Related CAB LA none 20MAR2025 at 1000 22MAR2025 at 1200  Headache NSAE 1 Related CAB LA none 20MAR2025 at 1000 22MAR2025 at 1200  Left ventrogluteal injection site tenderness NSAE 2 Related to CAB LA none 23APR2025 at 2200 27APR2025 at 0900  Nausea NSAE 1 Related to CAB LA none 26APR2025 at 0900  27APR2025 at 0900  Right ventrogluteal  injection site tenderness NSAE 1 Related to ULA CAB  none 25Jun2025 at 1000 29Jun2025 at 1000  Left Ventrogluteal injection site tenderness NSAE 2 Related to CAB ULA none 22Aug2025 at 0900 25Aug2025 at 0900  Small Bump (approx. 0.9mm) Right Thigh, Furuncle NSAE 2 Not Related none 05Sep2025 30Sep2025  Urethritis NSAE 2 Not Related None 04Oct2025 08Oct2025  Worsening Acid Reflux NSAE 2 Not Related None 18Oct2025   Atypical Chest Pain NSAE 1 Not Related None 05Oct2025 05Oct2025  Right ventrogluteal injection site tenderness NSAE 1 Related to CAB ULA none 16DEC2025 20DEC2025   Medical History Review  Diagnosis Grade Start Date End Date  Stomach Ulcers 2 2021 2021  Seasonal allergies 2 2008   Dry Skin 2 05Jul2011   Acid Reflux 1 2022 17Oct2025            []  [x]  Contraception Review: Report in eCRF Visits: M8, M9, M10, M11, M13, M17, M21, M25, M29, M33, ED, 4WFU, LTFU Visits Male of childbearing potential: Must agree to use a highly effective method of contraception consistently and correctly, must also continue to use adequate contraception methods for at least 52 weeks after the last injection. Male condoms must be used in addition to hormonal contraception Sexual Abstinence: considered a highly effective method only if defined as refraining from heterosexual intercourse during the entire period of risk associated with the study intervention.  The reliability of sexual abstinence needs to be evaluated in relation to the duration of the study and  the preferred and usual lifestyle of the participant.   Yes No/NA Completed:  []  [x]  Review Changes in Medical History:   Visits: M13, M21, M29, M33  Premature cardiovascular disease: male participant <65 years or male participant <55 years in first degree relatives only.  Note changes  since the start of the study.    [] No Changes    Substance Usage: (list substance, amount used, start and stop dates)  Social History   Substance and Sexual Activity  Alcohol Use Not Currently   Comment: Started in 2017, does not normally drink alcohol regularlly, last drink 17AUG2025    Social History   Substance and Sexual Activity  Drug Use No   Comment: Prior THC, Started 2014, stopped 01Feb2025    Tobacco Use: Low Risk (04/24/2024)   Patient History    Smoking Tobacco Use: Never    Smokeless Tobacco Use: Never    Passive Exposure: Never    Caffeine Use:  caffeine containing beverages/day  []  [x]  Triplicate 12-lead ECG     Visits: M9, M29, ED Perform in a semi-supine position after 5 minutes of rest.  3 individual ECG tracing should be obtained as closely as possible in succession, but no more than 2 minutes apart. The full set of triplicates should be completed in less than 6 minutes. If ECG abnormal clinically significant  then report as an AE/SAE.  Rest Start Time:  ECG #1 start time:     ECG #2:     ECG #3:    []  [x]  Vital Signs:  [] Weight, BMI (measured in a semi supine position after 5 minutes rest)  Visits:  M9, M13, M17, M21, M25, M29, M33, ED, 4WFU, LTFU Visits  [] Height    Visit: M9  [] BP and Pulse (measured in a semi supine position after 5 minutes rest)  Visits: M9, M13, M17, M21, M25, M29, M33, ED, 4WFU,  LTFU Visits  [] Temperature and RR:    Visits: M13, ED  Rest Start Time:   Vital Signs Start Time:     There is no height or weight on file to calculate BMI.  There were no vitals filed for this visit.   []  [x]  Brief Physical Examination: Will include at a minimum,  assessments of skin, lungs, cardiovascular system, abdomen (liver and spleen). Perform the symptom directed exam when there are symptoms present. In addition to the brief physical exam. Any abnormalities must be noted in the CRF (e.g current medical conditions or AE logs).     Visit: M8, M9, M11, M13, M17, M21, M25, M29, M33, ED, 4WFU, LTFU   Physical Exam    Yes No Blood Draw Tracking:  All Visits  [x]  []  Verified previous Hgb to be >= 10 (if value <10, adjust volume per unit SOP)                          [] NA, healthy volunteer, previous Hgb results are unavailable  []  [x]  Assess Fasting Status.   Visits: M13, M33                                                      Is participant Fasting for at least 6 hours, overnight fast preferred?  []  No   []  Yes, Date/Time of last meal?   [x]  []  Blood Drawn.     Problems?  [x]  No   [] Yes, why?   Time: 0832    Total Vol: 2 mL   Yes No Lab Collection:  []  [x]  RAPID HIV TESTING        Visits: M9, M13, M17, M21, M25, M29,  ED, 4WFU, LTFU  [] REACTIVE or [] NON REACTIVE  If positive/reactive participant must be withdrawn from the study even if subsequent confirmatory testing is negative. If reactive participant referred for further testing and clinical management as per local standard of care   []  [x]  Chemistry     Visits: M8, M9, M10, M11, M13, M17, M21, M25, M29, M33, ED, 4WFU, LTFU  []  [x]  Hematology     Visits:  M8, M9, M10, M11, M13, M17, M21, M25, M29, M33, ED, 4WFU, LTFU  []  [x]  Cystatin C     Visits:  M8, M9, M10, M11, M13, M17, M21, M25, M29, M33, ED, 4WFU, LTFU  []  [x]  Non rapid HIV immunoassay    Visits: M9, M13, M17, M21, M25, M29, M33, ED, 4WFU, LTFU  []  [x]  HIV-1 RNA     Visits: M9, M13, M17, M21, M25, M29, M33, ED, 4WFU, LTFU  []  [x]  Neisseria gonorrhea (GC), Chlamydia trachomatis (CT), Trichomononas vaginalis (TV), syphilis.  If positive we will refer for treatment as per local guidelines.   Visit: M9, M13, M17, M21, M25, M29, M33, ED,  4WFU, LTFU, PRN   **PRN if symptomatic at other visits** Appropriate swabs to be collected based on reported sexual history.  [] GC/CT NAAT urine/vaginal swab [] GC and CT: Rectal Swab [] Oropharyngeal Swab [] TV: vaginal Swab   *If male and menstruating may take sample at next visit  []  [x]  Coagulation Tests     Visits: M13, M33, ED  []  [x]  Hepatitis B&C     Visits: M13, M25, ED  []  [x]  Fasting Labs: Glucose     Visits: M13, M33, ED (Insulin, HbA1c only collect if withdrawal visit occurs at M13 or M33)  []  [x]  Plasma for Storage     Visits: M8, M9, M10, M11, M13, M17, M21, M25, M29, ED  []  [x]  Predose: PK Sampling  Collect  < 1 hr before injection Visits: M9, M13, M17, M21, M25, M29  []  [x]  Illicit Drug Screen     Visits: M13, M21, M29, M33  []  [x]  Urinalysis: Morning specimen is preferred     Visits: M13, M33, ED, 4WFU   Time of Collection:   LMP: No LMP for male patient.  []  N/A   Yes No/NA Lab Collection, Cont.:  []  [x]  Urine Pregnancy Test (POCBP only)  Visits: M9, M13, M17, M21, M25, M29, M33 , ED, 4WFU, LTFU       Note: Must be resulted prior to receipt of IP  Time of Collection:     [] REACTIVE or [] NON REACTIVE  [] NA, assigned Male at birth or Not a POCBP   []                          [x]  Serum Pregnancy Test ([facility] only) if urine test positive, perform a serum test to confirm.   Yes No/NA Study Drug Administration  []  [x]  CAB ULA Administration:   Visit: M9, M13, M17, M21, M25, M29  Sites should be rotated between right and left Needle Size: [] 1.5  [] 2 (For BMI >/= 30)   There is no height or weight on file to calculate BMI.  Gauge: [] 21G   Site: [] Left Ventrogluteal  [] Right Ventrogluteal  Time of Administration:    [x]  []  ISR Review completed, ISR photography necessary [] Yes[x] No     All Visits *Digital photographs will be documented at all visits (scheduled or unscheduled) on all participants who have an injection site reaction that is a visible, persistent Grade  2 (no improvement/resolution in >10 days), Grade >/=3, or serious. *Examination will include a physician/HCP assessment of pain (or tenderness), pruritus, warmth, infections, rash, erythema (or redness), swelling (or induration), and nodules (granulomas or cysts) *For injection site nodules, the largest diameter will be recorded in the eCRF at each ISR assessment for nodules of at least Grade 1 severity  *Remote Visits: M7+2W, M8+2W, M9+1W, M12, M15, M19, M23, M27, M31 should assess whether participants have any new ISR symptoms/signs since their last visit or have previously reported ISR that has not improved or is worsening, if present site should perform an unscheduled visits ASAP for ISR assessment   Yes No/NA  Pharmacokinetic Sampling  [x]  []   Post Dose PK sampling:  Visits: M8, M9, M9+1Wk, M10, M11, M13, M13+1, M17+1, M33, ED, 4WFU, LTFU   On dosing days collect PK 2 hours after injection (+/- 30 mins collection window)  Time of PK Collection: 9167 Last injection 16DEC2025@0953    Yes No Completed:  [x]  []   HIV/STI  Counseling/Condom Distribution:    All Visits, except remote calls Condoms: [] Accepted  [x] Declined   Yes No/NA Completed  []  [x]  Was there an ISR during remote phone call visit:  [] Yes [x]  No If yes, schedule unscheduled ISR assessment ASAP Date:   [x]  []  Assess for Protocol Deviations  [x]  []  Reminders:  Participants of childbearing potential agree to use a highly effective method of contraception consistently and correctly HIV vaccines are not permitted at any time during the study No other experimental agents, antiretroviral drugs, cytotoxic chemotherapy, or radiation therapy may not be administered throughout the trial No systemically administered immunomodulator's permitted Notify study staff if you experience any signs/symptoms, visit provider in ER or scheduled evaluation, or start any new OTC, supplemental or prescription therapies Instructed to contact site if  ISR gets worse or does not improve after 10 days from the last ISR assessment.   Yes No Completed:  [x]  []  Compensation:  You will receive a payment of $78.00 at the end of each non dosing visit completed to reimburse you for your time and effort.  You will receive $125.00 at the end of each dosing visit completed to reimburse you for your time and effort.   If you must return to the clinic for an unscheduled visit, you will receive $78.  Phone visits will be reimbursed at $30.00.  Amount: $78.00   [x]  []  Schedule Next Visit: Visits should be planned using a calendar months and scheduled based on the date of the Day 1 injection.    Remote Call Visits schedule on M7+2wk, M8+2wk, M12, M15, M19, M23, M27, M31 (Assess ISR, AES, Conmeds)  If transitioning to CAB 200mg /ml do not need LTFU visits, but do need ED visit and 4 week followup visit.  Date: 19JAN2026     Time: 0830   Version 3.1 LOA03 24Nov2025 JLS

## 2024-05-04 NOTE — Research (Signed)
 Johnny Nelson

## 2024-05-06 NOTE — Progress Notes (Signed)
 I have reviewed data including laboratory values for this participant in the EXTEND [id number] study

## 2024-05-22 ENCOUNTER — Other Ambulatory Visit: Payer: Self-pay

## 2024-05-22 DIAGNOSIS — Z006 Encounter for examination for normal comparison and control in clinical research program: Secondary | ICD-10-CM

## 2024-05-22 NOTE — Research (Addendum)
 [name] [id number]                                                  Protocol Amendment 03  [facility] Research Site:  [id number]                                                                  TREATMENT PHASE   Visit:  [] M7+2W     [] M8    [] M8+2W   [] M9    [] M9+1W   [x] M10    [] M11    [] M12    [] M13   [] M13+1W    [] M15    [] M17    [] M17+1W     [] M19    [] M21   [] M23    [] M25   [] M27    [] M29    [] M31   [] M33   [] ED    [] 4 wk followup (if not entering LTFU)   [] LTFU M1   [] LTFU M4    [] LTFU M8    [] LTFU M12     Date: 13Jan2026         4 Letter Code: MCEA Subject ID: [id number]  Assessments should occur in the following order:  eCSSRs  All other questionnaires  Triplicate 12-lead ECG  Vital signs  Blood draws (clinical laboratory tests, pre-dose pharmacokinetics, etc)  IP injection  Post-dose pharmacokinetics (if required)  Yes No Completed:  [x]  []  Verify participant identity before doing any study procedures   All Visits  [x]  []  Verify correct version of ICF is signed   All Visits   Yes No Questionnaires Completed: Patient Reported Outcome Assessment must be completed before other assessments take place at each designated visit. Completed electronically by participants.  []  [x]  CSSRs    Visits: M9, M13, M17, M21, M25, M29, M33, ED Any positive (abnormal) response indicating SIB or any unusual changes in behavior, confirmed by the investigator will result in their discontinuation of study intervention, the PI/SI will arrange for urgent specialist psychiatric evaluation and management  []  [x]  Social Determinants of Health Questionnaire (SDoH)     Visits: M17, ED  []  [x]  Study Medication Satisfaction Questionnaire (SMSQs)     Visits: M9, M13, M17, M21, M25, M29, M33, ED  []  [x]  PrEP Preference and Rationale Questionnaire (only if prior exposure to oral PrEP)     Visits: M9, M17, M25, M33, ED  []  [x]  Acceptability of Injection Questionnaire     Visits: M9, M13, M17, M21, M25, M29, M33, ED  []  [x]  Generalized  Anxiety Disorders (GAD-&) Questionnaire     Visits: M9, M13, M17, M21, M25, M29, M33, ED  []  [x]  HIV-risk and PrEP use Anxiety and Stigma Questionnaire prior to the study drug administration     Visits: M17, M29, M33, ED  []  [x]  Sexual Health Practices Questionnaire-administer prior to study drug administration Visits: M9, M13, M17, M21, M25, M29, M33, ED    Yes No Completed:  [x]  []  Review Changes in Concomitant Medications    All Visits  [x]  []  Assess for AEs, SAEs: All AEs (from Day 1 onwards) and SAEs (from Screening onwards) must be recorded in  eCRF at all visits and reported within required timelines.    All Visits  Is participant experiencing any symptomatic STIs currently?  [] Yes [x] No (if yes, perform STI testing)  Is participant experiencing any signs or symptoms consistent with acute HIV infection?  [] Yes [x] No (if yes, perform HIV testing)   [x]  []  Pull Review in:  Study: EXTEND4M Subj ID: [id number] 4 Letter Code: MCEA  Medication Review Medication Indication Dose/Route/Frequency Start Date End Date  cetirizine  (Zyrtec )    Seasonal Allergies 10 mg tab PO QD 05Aug2023   Tylenol  Right ventrogluteal Injection Site tenderness 1000 mg PO once daily 20MAR2025 22MAR2025  Tylenol  Left Ventrogluteal Injection Site Tenderness 1000mg  PO once daily 26APR2025 26APR2025  Lotrimin AF Cream Small Bump (approx. 0.51mm) Right Thigh, Furuncle 1 application daily  05SEP2025 18SEP2025  Triple Antibiotic Ointment Polymyxin B Sulfate/Bacitracin zinc, neomycin sulfate Small Bump (approx. 0.73mm) Right Thigh, Furuncle 1 application daily 19SEP2025 30Sep2025  Ceftriaxone  Urethritis 500 mg IM Once 05Oct2025 05Oct2025  Doxycycline  Urethritis 100 mg PO BID 05Oct2025 13Oct2025  TUMS (Calcium carbonate Acid Reflux 2 tablets PO PRN 18Oct2025    AE Review Diagnosis/Symptoms Indicate: NSAE SAE AESI Maximum Grade Related/Not Related to study intervention CAB LA or CAB ULA Action/Dose Change Start  Date (Time, if available) End Date (Time, if available)  Right ventrogluteal injection site tenderness NSAE  2 Related CAB LA none 20MAR2025 at 1000 22MAR2025 at 1200  Headache NSAE 1 Related CAB LA none 20MAR2025 at 1000 22MAR2025 at 1200  Left ventrogluteal injection site tenderness NSAE 2 Related to CAB LA none 23APR2025 at 2200 27APR2025 at 0900  Nausea NSAE 1 Related to CAB LA none 26APR2025 at 0900  27APR2025 at 0900  Right ventrogluteal  injection site tenderness NSAE 1 Related to ULA CAB  none 25Jun2025 at 1000 29Jun2025 at 1000  Left Ventrogluteal injection site tenderness NSAE 2 Related to CAB ULA none 22Aug2025 at 0900 25Aug2025 at 0900  Small Bump (approx. 0.27mm) Right Thigh, Furuncle NSAE 2 Not Related none 05Sep2025 30Sep2025  Urethritis NSAE 2 Not Related None 04Oct2025 08Oct2025  Worsening Acid Reflux NSAE 2 Not Related None 18Oct2025   Atypical Chest Pain NSAE 1 Not Related None 05Oct2025 05Oct2025  Right ventrogluteal injection site tenderness NSAE 1 Related to CAB ULA none 16DEC2025 20DEC2025   Medical History Review  Diagnosis Grade Start Date End Date  Stomach Ulcers 2 2021 2021  Seasonal allergies 2 2008   Dry Skin 2 05Jul2011   Acid Reflux 1 2022 17Oct2025           []  [x]  NA Contraception Review: Report in eCRF Visits: M8, M9, M10, M11, M13, M17, M21, M25, M29, M33, ED, 4WFU, LTFU Visits Male of childbearing potential: Must agree to use a highly effective method of contraception consistently and correctly, must also continue to use adequate contraception methods for at least 52 weeks after the last injection. Male condoms must be used in addition to hormonal contraception Sexual Abstinence: considered a highly effective method only if defined as refraining from heterosexual intercourse during the entire period of risk associated with the study intervention.  The reliability of sexual abstinence needs to be evaluated in relation to the duration of the study and  the preferred and usual lifestyle of the participant.   Yes No/NA Completed:  []  [x]  Review Changes in Medical History:   Visits: M13, M21, M29, M33  Premature cardiovascular disease: male participant <65 years or male participant <55 years in first degree relatives only.  Note changes  since the start of the study.    [] No Changes    Substance Usage: (list substance, amount used, start and stop dates)  Social History   Substance and Sexual Activity  Alcohol Use Not Currently   Comment: Started in 2017, does not normally drink alcohol regularlly, last drink 17AUG2025    Social History   Substance and Sexual Activity  Drug Use No   Comment: Prior THC, Started 2014, stopped 01Feb2025    Tobacco Use: Low Risk (05/21/2024)   Received from [facility]   Patient History    Smoking Tobacco Use: Never    Smokeless Tobacco Use: Never    Passive Exposure: Not on file    Caffeine Use:  caffeine containing beverages/day  []  [x]  Triplicate 12-lead ECG     Visits: M9, M29, ED Perform in a semi-supine position after 5 minutes of rest.  3 individual ECG tracing should be obtained as closely as possible in succession, but no more than 2 minutes apart within 1 hour prior to dosing. The full set of triplicates should be completed in less than 6 minutes. If ECG abnormal clinically significant  then report as an AE/SAE.  Rest Start Time:  ECG #1 start time:     ECG #2:     ECG #3:    []  [x]  Vital Signs:  [] Weight, BMI (measured in a semi supine position after 5 minutes rest)  Visits:  M9, M13, M17, M21, M25, M29, M33, ED, 4WFU, LTFU Visits  [] Height    Visit: M9  [] BP and Pulse (measured in a semi supine position after 5 minutes rest)  Visits: M9, M13, M17, M21, M25, M29, M33, ED, 4WFU,  LTFU Visits  [] Temperature and RR:    Visits: M13, ED  Rest Start Time:   Vital Signs Start Time:     There is no height or weight on file to calculate BMI.  There were no vitals filed for this visit.    [x]  []  Brief Physical Examination: Will include at a minimum, assessments of skin, lungs, cardiovascular system, abdomen (liver and spleen). Perform the symptom directed exam when there are symptoms present. In addition to the brief physical exam. Any abnormalities must be noted in the CRF (e.g current medical conditions or AE logs).     Visit: M8, M9, M10, M11, M13, M17, M21, M25, M29, M33, ED, 4WFU, LTFU   Physical Exam Constitutional:      Appearance: Normal appearance.  Neurological:     Mental Status: He is alert and oriented to person, place, and time. Mental status is at baseline.  Psychiatric:        Mood and Affect: Mood normal.        Behavior: Behavior normal.        Thought Content: Thought content normal.        Judgment: Judgment normal.   Exam unremarkable, denies any new medical issues/concerns    Yes No Blood Draw Tracking:  All Visits  [x]  []  Verified previous Hgb to be >= 10 (if value <10, adjust volume per unit SOP)                          [] NA, healthy volunteer, previous Hgb results are unavailable  []  [x]  Assess Fasting Status.   Visits: M13, M33  Is participant Fasting for at least 6 hours, overnight fast preferred?  []  No   []  Yes, Date/Time of last meal?   [x]  []  Blood Drawn.     Problems?  [x]  No   [] Yes, why?   Time: 0902    Total Vol: 23 mL   NOTE: Syphilis lab recollected at Month 10 visit per sponsor request due to central lab not receiving syphilis serology at previous visit.  Sponsor requested for sample to be recollected at Month 10 visit using an Unscheduled Kit. Email correspondence can be found in paper chart record.    Yes No Lab Collection:  []  [x]  RAPID HIV TESTING        Visits: M9, M13, M17, M21, M25, M29, ED, 4WFU, LTFU  [] REACTIVE or [] NON REACTIVE  If positive/reactive participant must be withdrawn from the study even if subsequent confirmatory testing is negative. If reactive  participant referred for further testing and clinical management as per local standard of care   [x]  []  Chemistry     Visits: M8, M9, M10, M11, M13, M17, M21, M25, M29, M33, ED, 4WFU, LTFU  [x]  []  Hematology     Visits:  M8, M9, M10, M11, M13, M17, M21, M25, M29, M33, ED, 4WFU, LTFU  [x]  []  Cystatin C     Visits:  M8, M9, M10, M11, M13, M17, M21, M25, M29, M33, ED, 4WFU, LTFU  []  [x]  Non rapid HIV immunoassay    Visits: M9, M13, M17, M21, M25, M29, M33, ED, 4WFU, LTFU  []  [x]  HIV-1 RNA     Visits: M9, M13, M17, M21, M25, M29, M33, ED, 4WFU, LTFU  []  [x]  Neisseria gonorrhea (GC), Chlamydia trachomatis (CT), Trichomononas vaginalis (TV), syphilis.  If positive we will refer for treatment as per local guidelines.   Visit: M9, M13, M17, M21, M25, M29, M33, ED, 4WFU, LTFU, PRN   **PRN if symptomatic at other visits** Appropriate swabs to be collected based on reported sexual history.  [] GC/CT NAAT urine/vaginal swab [] GC and CT: Rectal Swab [] Oropharyngeal Swab [] TV: vaginal Swab   *If male and menstruating may take sample at next visit  []  [x]  Coagulation Tests     Visits: M13, M33, ED  []  [x]  Hepatitis B&C     Visits: M13, M25, ED  []  [x]  Fasting Labs: Glucose     Visits: M13, M33, ED (Insulin, HbA1c only collect if withdrawal visit occurs at M13 or M33)  [x]  []  Plasma for Storage     Visits: M8, M9, M10, M11, M13, M17, M21, M25, M29, ED  []  [x]  Predose: PK Sampling  Collect  < 1 hr before injection Visits: M9, M13, M17, M21, M25, M29  []  [x]  Illicit Drug Screen     Visits: M13, M21, M29, M33  []  [x]  Urinalysis: Morning specimen is preferred     Visits: M13, M33, ED, 4WFU   Time of Collection:   LMP: No LMP for male patient.  [x]  N/A   Yes No/NA Lab Collection, Cont.:  []  [x]  Urine Pregnancy Test (POCBP only)  Visits: M9, M13, M17, M21, M25, M29, M33 , ED, 4WFU, LTFU       Note: Must be resulted prior to receipt of IP  Time of Collection:     [] REACTIVE or [] NON REACTIVE  [x] NA,  assigned Male at birth or Not a POCBP   []                          [x]   Serum Pregnancy Test (POCBP only) if urine test positive, perform a serum test to confirm.   Yes No/NA Study Drug Administration  []  [x]  CAB ULA Administration:   Visit: M9, M13, M17, M21, M25, M29  Sites should be rotated between right and left Needle Size: [] 1.5  [] 2 (For BMI >/= 30)   There is no height or weight on file to calculate BMI.  Gauge: [] 21G   Site: [] Left Ventrogluteal  [] Right Ventrogluteal  Time of Administration:    [x]  []  ISR Review completed, ISR photography necessary [] Yes[x] No     All Visits *Digital photographs will be documented at all visits (scheduled or unscheduled) on all participants who have an injection site reaction that is a visible, persistent Grade 2 (no improvement/resolution in >10 days), Grade >/=3, or serious. *Examination will include a physician/HCP assessment of pain (or tenderness), pruritus, warmth, infections, rash, erythema (or redness), swelling (or induration), and nodules (granulomas or cysts) *For injection site nodules, the largest diameter will be recorded in the eCRF at each ISR assessment for nodules of at least Grade 1 severity  *Remote Visits: M12, M15, M19, M23, M27, M31 should assess whether participants have any new ISR symptoms/signs since their last visit or have previously reported ISR that has not improved or is worsening, if present site should perform an unscheduled visits ASAP for ISR assessment   Yes No/NA  Pharmacokinetic Sampling  [x]  []   Post Dose PK sampling:  Visits: M8, M9, M9+1Wk, M10, M11, M13, M13+1, M17+1, M33, ED, 4WFU, LTFU   On dosing days collect PK 2 hours after injection (+/- 30 mins collection window)  Time of PK Collection: 0902    Yes No Completed:  [x]  []   HIV/STI  Counseling/Condom Distribution:    All Visits, except remote calls Condoms: [] Accepted  [x] Declined   Yes No/NA Completed  []  [x]  Was there an ISR during remote  phone call visit:  [] Yes []  No If yes, schedule unscheduled ISR assessment ASAP Date:   [x]  []  Assess for Protocol Deviations  [x]  []  Reminders:  Participants of childbearing potential agree to use a highly effective method of contraception consistently and correctly HIV vaccines are not permitted at any time during the study No other experimental agents, antiretroviral drugs, cytotoxic chemotherapy, or radiation therapy may not be administered throughout the trial No systemically administered immunomodulator's permitted Notify study staff if you experience any signs/symptoms, visit provider in ER or scheduled evaluation, or start any new OTC, supplemental or prescription therapies Instructed to contact site if ISR gets worse or does not improve after 10 days from the last ISR assessment.   Yes No Completed:  [x]  []  Compensation:  You will receive a payment of $78.00 at the end of each non dosing visit completed to reimburse you for your time and effort.  You will receive $125.00 at the end of each dosing visit completed to reimburse you for your time and effort.   If you must return to the clinic for an unscheduled visit, you will receive $78.  Phone visits will be reimbursed at $30.00.  Amount: $78   [x]  []  Schedule Next Visit: Visits should be planned using a calendar months and scheduled based on the date of the Day 1 injection.    Remote Call Visits schedule on M7+2wk, M8+2wk, M12, M15, M19, M23, M27, M31 (Assess ISR, AES, Conmeds)  If transitioning to CAB 200mg /ml do not need LTFU visits, but do need ED visit and 4 week followup visit.  Date: 19Feb2026  Time: 0830   Version 3.3 LOA03 31Dec2025 JLS

## 2024-05-29 NOTE — Research (Signed)
 Johnny Nelson

## 2024-05-30 NOTE — Progress Notes (Signed)
"   I have reviewed the participant's data from their visit including laboratory data for this study visit for the EXTEND  study.  "

## 2024-07-10 ENCOUNTER — Ambulatory Visit: Admitting: Family Medicine
